# Patient Record
Sex: Female | Born: 1950 | Race: White | Hispanic: No | Marital: Single | Smoking: Former smoker
Health system: Southern US, Community
[De-identification: ages and names within clinical notes are randomized; demographics above are authoritative.]

## PROBLEM LIST (undated history)

## (undated) DIAGNOSIS — L509 Urticaria, unspecified: Secondary | ICD-10-CM

## (undated) DIAGNOSIS — E7800 Pure hypercholesterolemia, unspecified: Secondary | ICD-10-CM

## (undated) DIAGNOSIS — E039 Hypothyroidism, unspecified: Secondary | ICD-10-CM

## (undated) HISTORY — DX: Pure hypercholesterolemia, unspecified: E78.00

## (undated) HISTORY — DX: Urticaria, unspecified: L50.9

## (undated) HISTORY — DX: Hypothyroidism, unspecified: E03.9

---

## 1990-08-07 HISTORY — PX: TUBAL LIGATION: SHX77

## 2008-08-07 HISTORY — PX: HALLUX VALGUS CORRECTION: SUR315

## 2011-10-27 DIAGNOSIS — E063 Autoimmune thyroiditis: Secondary | ICD-10-CM | POA: Insufficient documentation

## 2011-10-27 DIAGNOSIS — L501 Idiopathic urticaria: Secondary | ICD-10-CM | POA: Insufficient documentation

## 2011-10-27 DIAGNOSIS — K589 Irritable bowel syndrome without diarrhea: Secondary | ICD-10-CM | POA: Insufficient documentation

## 2011-11-17 DIAGNOSIS — R42 Dizziness and giddiness: Secondary | ICD-10-CM | POA: Insufficient documentation

## 2012-11-06 ENCOUNTER — Other Ambulatory Visit: Payer: Self-pay | Admitting: Obstetrics & Gynecology

## 2012-11-06 DIAGNOSIS — R928 Other abnormal and inconclusive findings on diagnostic imaging of breast: Secondary | ICD-10-CM

## 2012-11-20 ENCOUNTER — Ambulatory Visit
Admission: RE | Admit: 2012-11-20 | Discharge: 2012-11-20 | Disposition: A | Discharge status: Home / Self Care | Payer: BC Managed Care – PPO | Source: Ambulatory Visit | Attending: Obstetrics & Gynecology | Admitting: Obstetrics & Gynecology

## 2012-11-20 DIAGNOSIS — R928 Other abnormal and inconclusive findings on diagnostic imaging of breast: Secondary | ICD-10-CM

## 2013-05-15 ENCOUNTER — Other Ambulatory Visit: Payer: Self-pay | Admitting: Obstetrics & Gynecology

## 2013-05-15 DIAGNOSIS — N6320 Unspecified lump in the left breast, unspecified quadrant: Secondary | ICD-10-CM

## 2013-05-28 ENCOUNTER — Ambulatory Visit
Admission: RE | Admit: 2013-05-28 | Discharge: 2013-05-28 | Disposition: A | Discharge status: Home / Self Care | Payer: BC Managed Care – PPO | Source: Ambulatory Visit | Attending: Obstetrics & Gynecology | Admitting: Obstetrics & Gynecology

## 2013-05-28 DIAGNOSIS — N6320 Unspecified lump in the left breast, unspecified quadrant: Secondary | ICD-10-CM

## 2014-01-21 DIAGNOSIS — E039 Hypothyroidism, unspecified: Secondary | ICD-10-CM | POA: Insufficient documentation

## 2014-01-21 DIAGNOSIS — M1612 Unilateral primary osteoarthritis, left hip: Secondary | ICD-10-CM | POA: Insufficient documentation

## 2014-04-09 DIAGNOSIS — M25552 Pain in left hip: Secondary | ICD-10-CM | POA: Insufficient documentation

## 2014-07-24 DIAGNOSIS — M7741 Metatarsalgia, right foot: Secondary | ICD-10-CM | POA: Insufficient documentation

## 2014-08-07 HISTORY — PX: TOTAL HIP ARTHROPLASTY: SHX124

## 2014-09-24 IMAGING — US US BREAST*L*
1 series · 2 of 2 positions shown · non-contrast
Comparison: Mammography 10/24/2012, 01/13/2011, 01/10/2010.  No
prior ultrasound.

CLINICAL DATA: Called back from screening mammography, possible
left breast mass.

DIGITAL DIAGNOSTIC LEFT MAMMOGRAM  AND LEFT BREAST ULTRASOUND:

[Series 1: us breast*left* · 2 of 2 slices shown]
[im 1/2]
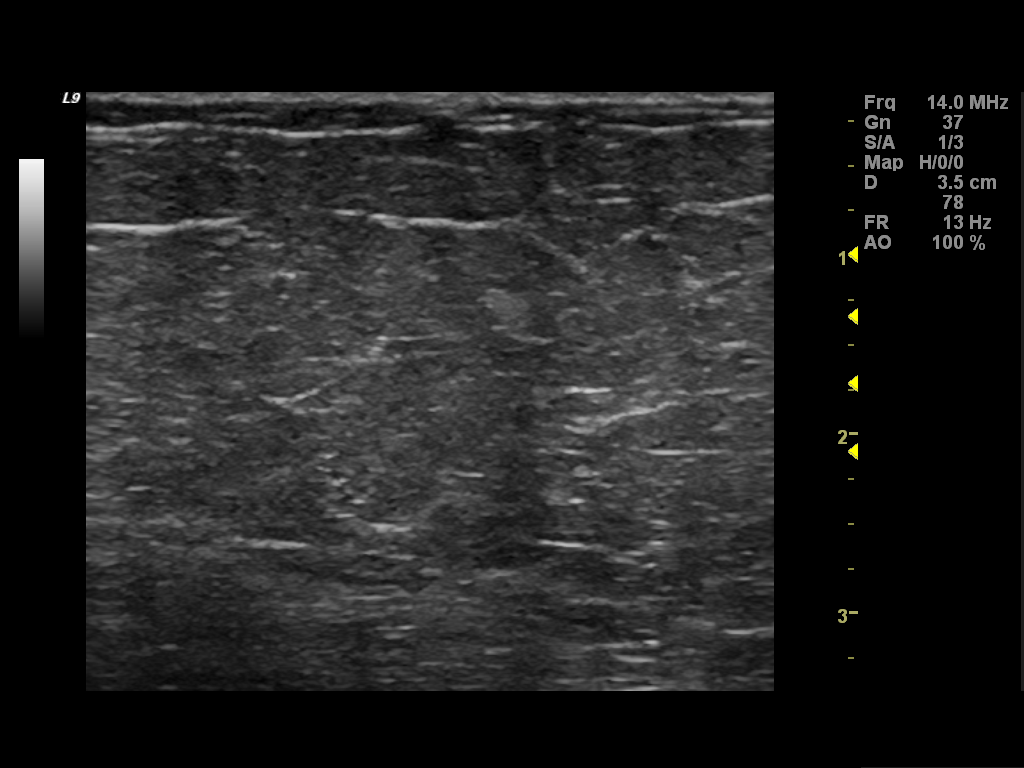
[im 2/2]
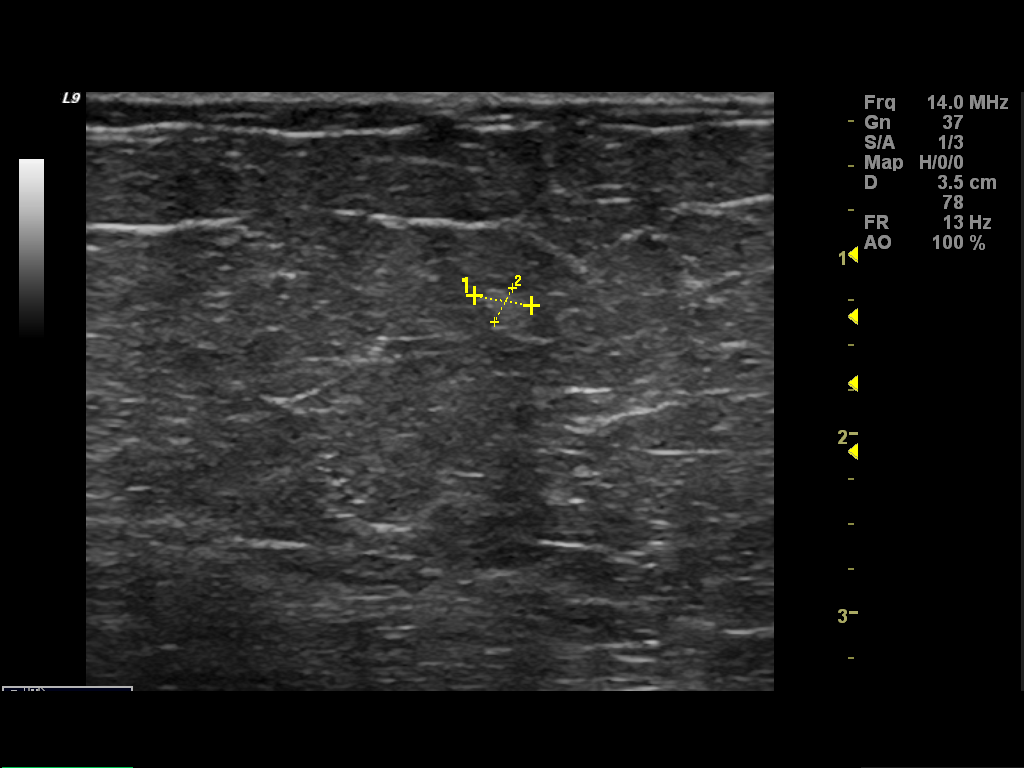

[2 of 2 positions shown; findings below may reference images not displayed]

FINDINGS: ACR Breast Density Category 2: There is a scattered fibroglandular
pattern.

Spot compression CC and MLO views of the area of concern in the
upper left breast were obtained.  There is a persistent vague
density on the spot compression views, but there is no associated
architectural distortion or suspicious calcifications.

On physical exam, there is no palpable abnormality in the upper
left breast.

Ultrasound is performed, showing a very small hyperechoic focus
within a fat lobule of the left breast at the 11 o'clock position,
7 cm from the nipple, measuring 0.3 x 0.2 cm in the antiradial
projection.  I was not able to find this in the radial projection
or upon subsequent imaging, so it may be artifactual and related to
a Cooper's ligament.  Certainly no suspicious solid mass or
abnormal acoustic shadowing was identified in the upper left
breast.
IMPRESSION: Persistent very small density in the upper left breast on
mammography without a definite sonographic correlate.  Spot
mammographic images suggest normal fibroglandular tissue.

RECOMMENDATION:
Short-term mammographic follow-up was discussed with the patient,
in lieu of the absence of a definite sonographic correlate.  Left
breast diagnostic mammogram and possible ultrasound are suggested
in 6 months.

If the patient changes her mind and wishes to have this density
biopsied, this would need to be done stereotactically.

I have discussed the findings and recommendations with the patient.
Results were also provided in writing at the conclusion of the
visit.  If applicable, a reminder letter will be sent to the
patient regarding the next appointment.

BI-RADS CATEGORY 3:  Probably benign finding(s) - short interval
follow-up suggested.

## 2015-04-01 IMAGING — MG MM DIAGNOSTIC UNILATERAL L
2 series · 2 of 2 positions shown · non-contrast
Comparison: 11/20/2012

CLINICAL DATA: The original dictation was inadvertently
electronically lost. This is a repeat dictation on 06/04/2013.

Followup left breast asymmetry.
EXAM:
DIGITAL DIAGNOSTIC  left MAMMOGRAM
Mammographic images were processed with CAD.

[L CC]
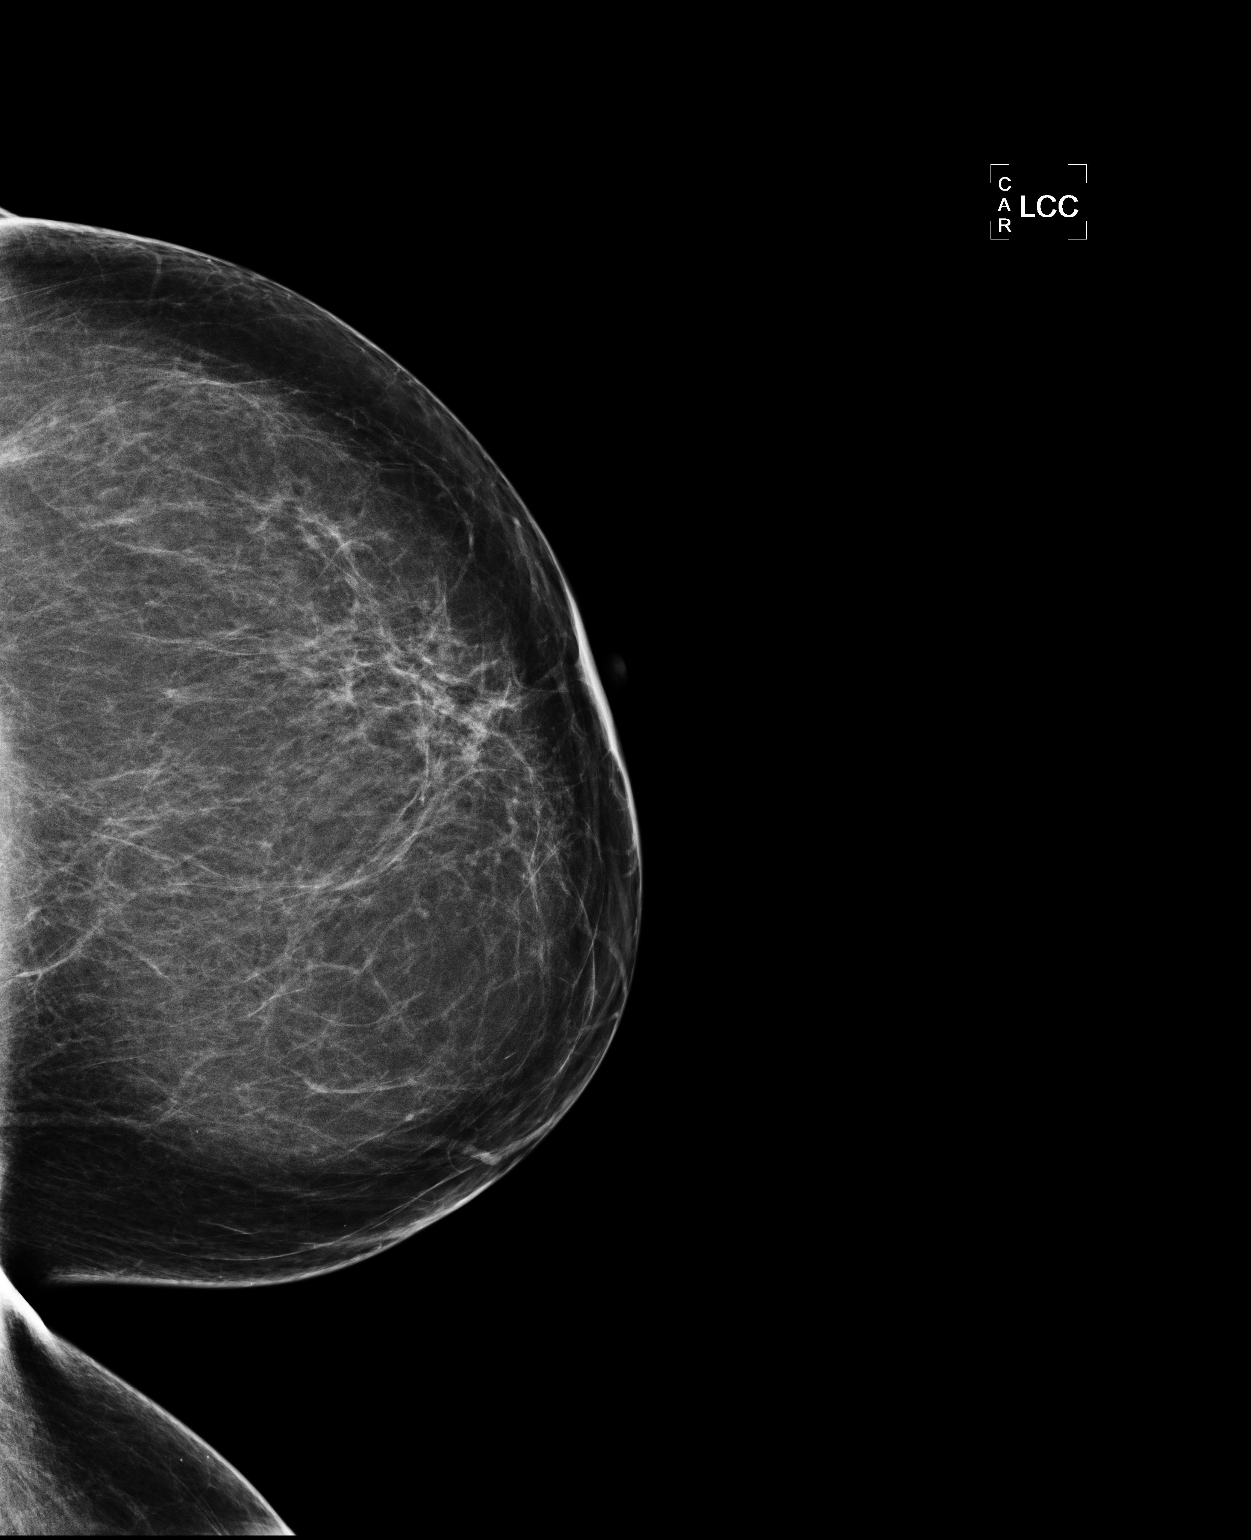

[L MLO]
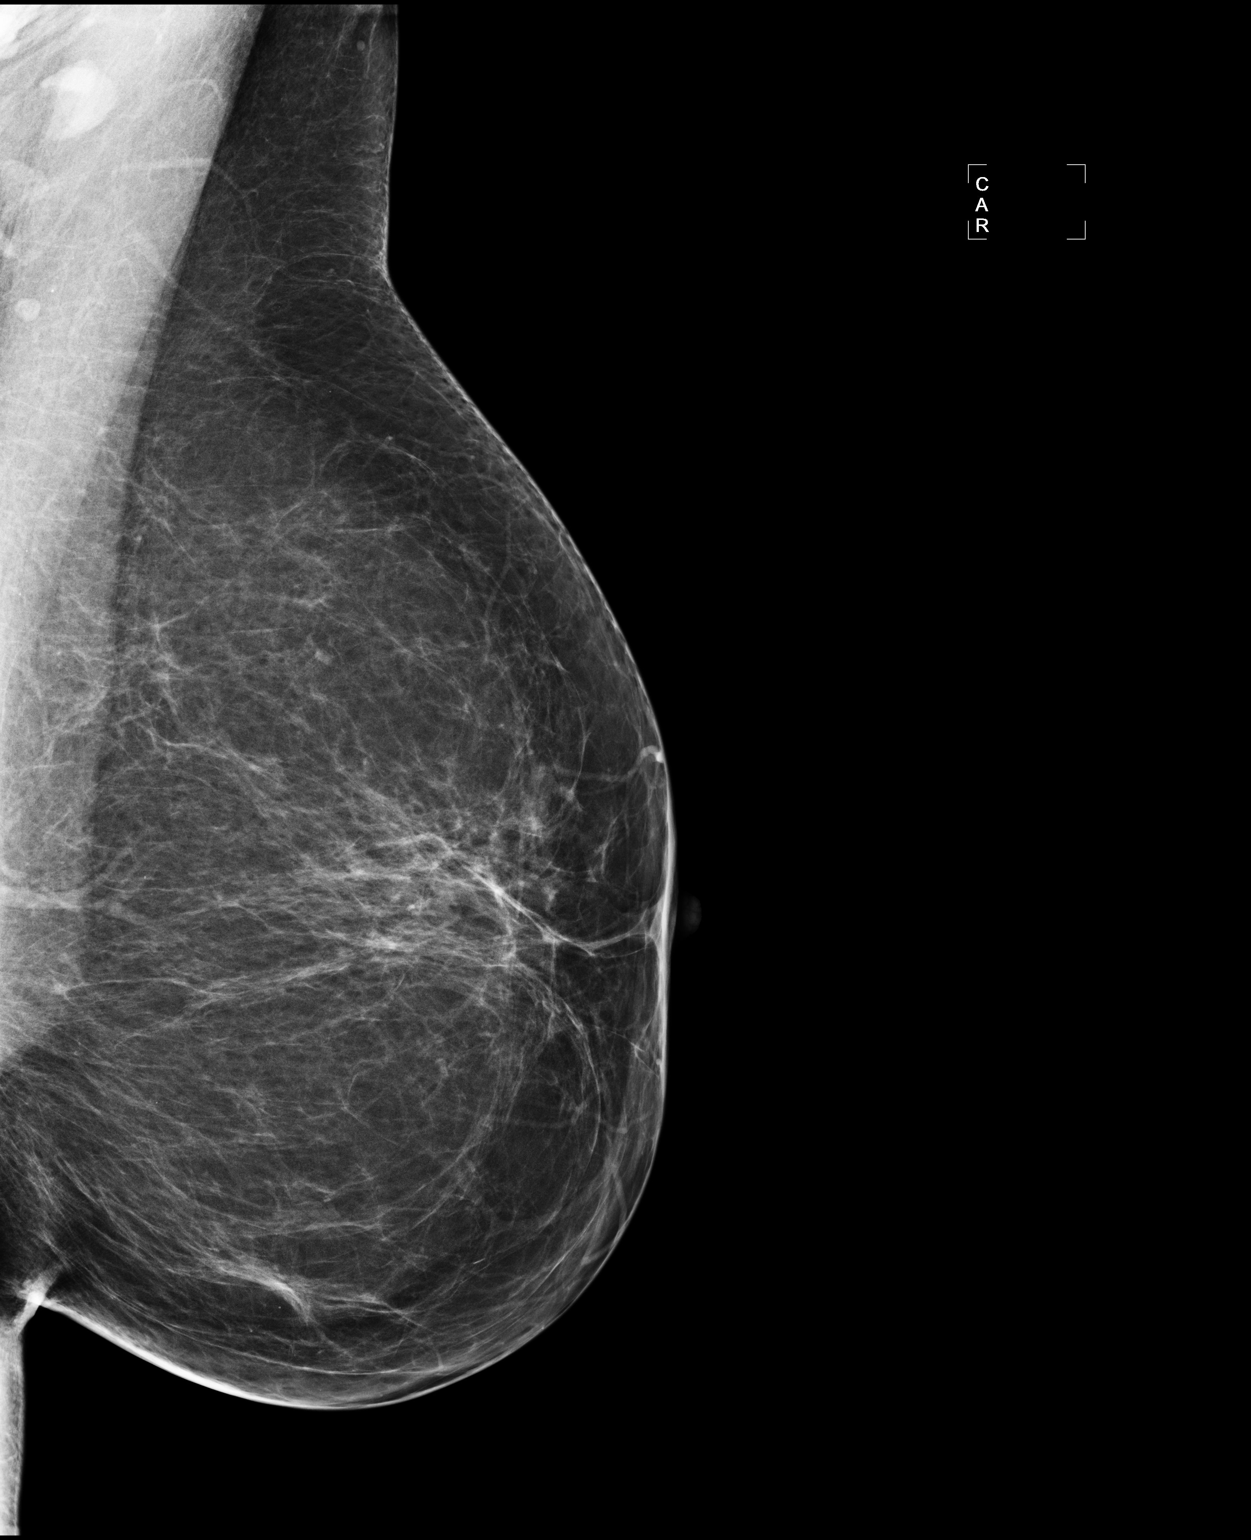

[2 of 2 positions shown; findings below may reference images not displayed]

ACR Breast Density Category b: There are scattered areas of
fibroglandular density.
FINDINGS: No suspicious finding is seen in the left breast.
IMPRESSION: No evidence for malignancy in the left breast. The previously
questioned finding is not reproduced on the current exam.

RECOMMENDATION:
Bilateral screening mammography November 2013

I have discussed the findings and recommendations with the patient.
Results were also provided in writing at the conclusion of the
visit. If applicable, a reminder letter will be sent to the patient
regarding the next appointment.

BI-RADS CATEGORY  1: Negative

## 2015-04-14 DIAGNOSIS — L501 Idiopathic urticaria: Secondary | ICD-10-CM | POA: Insufficient documentation

## 2016-06-06 ENCOUNTER — Ambulatory Visit: Payer: Self-pay | Admitting: Pediatrics

## 2016-06-13 ENCOUNTER — Encounter: Payer: Self-pay | Admitting: Pediatrics

## 2016-06-13 ENCOUNTER — Ambulatory Visit (INDEPENDENT_AMBULATORY_CARE_PROVIDER_SITE_OTHER): Payer: BLUE CROSS/BLUE SHIELD | Admitting: Pediatrics

## 2016-06-13 VITALS — BP 110/70 | HR 68 | Temp 98.1°F | Resp 16 | Ht 63.0 in | Wt 127.0 lb

## 2016-06-13 DIAGNOSIS — R5383 Other fatigue: Secondary | ICD-10-CM

## 2016-06-13 DIAGNOSIS — E038 Other specified hypothyroidism: Secondary | ICD-10-CM | POA: Diagnosis not present

## 2016-06-13 DIAGNOSIS — L503 Dermatographic urticaria: Secondary | ICD-10-CM | POA: Insufficient documentation

## 2016-06-13 DIAGNOSIS — E039 Hypothyroidism, unspecified: Secondary | ICD-10-CM | POA: Insufficient documentation

## 2016-06-13 DIAGNOSIS — L508 Other urticaria: Secondary | ICD-10-CM | POA: Insufficient documentation

## 2016-06-13 NOTE — Progress Notes (Signed)
[address] [place] [address] Dept: [phone]  New Patient Note  Patient ID: Morgan SayresKaren Lambert, female    DOB: 1951-01-17  Age: 65 y.o. MRN: [id number] Date of Office Visit: 06/13/2016 Referring provider: No referring provider defined for this encounter.    Chief Complaint: Urticaria (arms and inside thighs and face for 20 years, itching everyday )  HPI Morgan SayresKaren Lambert presents for evaluation of chronic urticaria for over 20 years. At one time her joints ached before  the urticaria appeared,. 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 Sometimes with the urticaria she has swelling of her upper lip and at times the nose. She needs prednisone 3 or 4 times per year because of her urticaria. Allergy skin testing many years ago was negative. She has been hypothyroid for about 26 years. 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 She has not had tick bites. She has not had blood work in the past few years to evaluate her chronic urticaria. She has urticaria despite the use of levocetirizine 5 mg at night, hydroxyzine 25 mg in the morning, montelukast  10 mg once a day, ranitidine 150 mg once a day and Benadryl as needed  Review of Systems  Constitutional: Negative.   HENT: Negative.   Eyes:       Beginning of a  cataract in one eye   Respiratory: Negative.   Cardiovascular: Negative.   Gastrointestinal: Negative.   Genitourinary: Negative.   Musculoskeletal:       Total left hip replacement.  Skin:       Chronic urticaria for over 20 years  Neurological:       History of migraine headaches  Endo/Heme/Allergies:       Hypothyroidism. No diabetes.  Psychiatric/Behavioral: Negative.     Outpatient Encounter Prescriptions as of 06/13/2016  Medication Sig  . atorvastatin (LIPITOR) 40 MG tablet   . Calcium Carb-Cholecalciferol (CALCIUM + D3) 600-200 MG-UNIT TABS Take by mouth.  . diphenhydrAMINE (BENADRYL) 25 mg capsule Take 25 mg by mouth.  . DOCOSAHEXAENOIC ACID PO Take by  mouth.  . fluticasone (FLONASE) 50 MCG/ACT nasal spray 2 sprays by Each Nare route daily.  . hydrOXYzine (ATARAX/VISTARIL) 25 MG tablet Take by mouth.  . levocetirizine (XYZAL) 5 MG tablet TAKE 1 TABLET (5 MG TOTAL) BY MOUTH EVERY EVENING.  Marland Kitchen. levothyroxine (SYNTHROID, LEVOTHROID) 75 MCG tablet   . montelukast (SINGULAIR) 10 MG tablet TAKE 1 TABLET (10 MG TOTAL) BY MOUTH NIGHTLY.  . ranitidine (ZANTAC) 150 MG tablet Take by mouth.   No facility-administered encounter medications on file as of 06/13/2016.      Drug Allergies:  Allergies  Allergen Reactions  . Sulfamethazine Itching    Family History: Morgan Lambert's family history includes Asthma in her daughter and grandchild; Eczema in her daughter..Food allergy in a granddaughter. There is no family history of hayfever, angioedema, eczema, hives, Lupus.  Social and environmental. There is a dog in the home. She is not exposed to cigarette smoking. She smoked cigarettes for 40 years and stopped smoking cigarettes 4 years ago.  Physical Exam: BP 110/70   Pulse 68   Temp 98.1 F (36.7 C) (Oral)   Resp 16   Ht 5\' 3"  (1.6 m)   Wt 127 lb (57.6 kg)   SpO2 97%   BMI 22.50 kg/m    Physical Exam  Constitutional: She is oriented to person, place, and time. She appears well-developed and well-nourished.  HENT:  Eyes normal. Ears normal. Nose normal. Pharynx normal.  Neck: Neck supple. No thyromegaly present.  Cardiovascular:  S1 and S2 normal no murmurs  Pulmonary/Chest:  Clear to percussion and auscultation  Abdominal: Soft. There is no tenderness (no hepatosplenomegaly).  Lymphadenopathy:    She has no cervical adenopathy.  Neurological: She is alert and oriented to person, place, and time.  Skin:  Clear but there was a suggestion of dermographia  Psychiatric: She has a normal mood and affect. Her behavior is normal. Judgment and thought content normal.  Vitals reviewed.   Diagnostics:  None  Assessment  Assessment and  Plan: 1. Fatigue, unspecified type   2. Chronic urticaria   3. Dermographia   4. Other specified hypothyroidism        Patient Instructions  Xyzal 5 mg the morning and hydroxyzine 25 mg at night Ranitidine 150 mg twice a day Montelukast  10 mg once a day Do foods with salicylates make you itch? Follow-up after Thanksgiving to do skin testing. Stop Xyzal and hydroxyzine and Benadryl 5 days before testing. Start prednisone 10 mg twice a day for 4 days 10 mg on the fifth day on the day that you stop the antihistamines I will call you with the results of your  lab work Information regarding the use of Xolair for chronic urticaria was given to the patient   Return in about 4 weeks (around 07/11/2016).   Thank you for the opportunity to care for this patient.  Please do not hesitate to contact me with questions.  Tonette BihariJ. A. Bardelas, M.D.  [facility] [address] [place], [place] [address] [phone]

## 2016-06-13 NOTE — Patient Instructions (Addendum)
Xyzal 5 mg the morning and hydroxyzine 25 mg at night Ranitidine 150 mg twice a day Montelukast  10 mg once a day Do foods with salicylates make you itch? Follow-up after Thanksgiving to do skin testing. Stop Xyzal and hydroxyzine and Benadryl 5 days before testing. Start prednisone 10 mg twice a day for 4 days 10 mg on the fifth day on the day that you stop the antihistamines I will call you with the results of your  lab work Information regarding the use of Xolair for chronic urticaria was given to the patient

## 2016-06-15 DIAGNOSIS — E7800 Pure hypercholesterolemia, unspecified: Secondary | ICD-10-CM | POA: Insufficient documentation

## 2016-06-19 ENCOUNTER — Ambulatory Visit: Payer: BLUE CROSS/BLUE SHIELD | Admitting: Pediatrics

## 2016-06-23 LAB — CBC WITH DIFFERENTIAL/PLATELET
BASOS ABS: 0.1 10*3/uL (ref 0.0–0.2)
Basos: 1 %
EOS (ABSOLUTE): 0.4 10*3/uL (ref 0.0–0.4)
EOS: 4 %
Hematocrit: 41.4 % (ref 34.0–46.6)
Hemoglobin: 13.9 g/dL (ref 11.1–15.9)
IMMATURE GRANULOCYTES: 0 %
Immature Grans (Abs): 0 10*3/uL (ref 0.0–0.1)
LYMPHS ABS: 2.3 10*3/uL (ref 0.7–3.1)
Lymphs: 23 %
MCH: 28 pg (ref 26.6–33.0)
MCHC: 33.6 g/dL (ref 31.5–35.7)
MCV: 84 fL (ref 79–97)
MONOCYTES: 9 %
MONOS ABS: 0.9 10*3/uL (ref 0.1–0.9)
NEUTROS PCT: 63 %
Neutrophils Absolute: 6.1 10*3/uL (ref 1.4–7.0)
Platelets: 168 10*3/uL (ref 150–379)
RBC: 4.96 x10E6/uL (ref 3.77–5.28)
RDW: 13.8 % (ref 12.3–15.4)
WBC: 9.7 10*3/uL (ref 3.4–10.8)

## 2016-06-23 LAB — COMPREHENSIVE METABOLIC PANEL
ALK PHOS: 73 IU/L (ref 39–117)
ALT: 16 IU/L (ref 0–32)
AST: 16 IU/L (ref 0–40)
Albumin/Globulin Ratio: 1.8 (ref 1.2–2.2)
Albumin: 4.4 g/dL (ref 3.6–4.8)
BUN/Creatinine Ratio: 15 (ref 12–28)
BUN: 11 mg/dL (ref 8–27)
Bilirubin Total: 0.3 mg/dL (ref 0.0–1.2)
CALCIUM: 10.2 mg/dL (ref 8.7–10.3)
CO2: 23 mmol/L (ref 18–29)
CREATININE: 0.75 mg/dL (ref 0.57–1.00)
Chloride: 104 mmol/L (ref 96–106)
GFR calc Af Amer: 98 mL/min/{1.73_m2} (ref >59)
GFR, EST NON AFRICAN AMERICAN: 85 mL/min/{1.73_m2} (ref >59)
GLUCOSE: 97 mg/dL (ref 65–99)
Globulin, Total: 2.5 g/dL (ref 1.5–4.5)
Potassium: 4.5 mmol/L (ref 3.5–5.2)
Sodium: 145 mmol/L — ABNORMAL HIGH (ref 134–144)
Total Protein: 6.9 g/dL (ref 6.0–8.5)

## 2016-06-23 LAB — ANA W/REFLEX IF POSITIVE: ANA: NEGATIVE

## 2016-06-23 LAB — SEDIMENTATION RATE: SED RATE: 2 mm/h (ref 0–40)

## 2016-06-23 LAB — CHRONIC URTICARIA

## 2016-07-06 ENCOUNTER — Telehealth: Payer: Self-pay

## 2016-07-06 NOTE — Telephone Encounter (Signed)
Per Dr. Beaulah DinningBardelas, call patient and ask how she is doing.  Is she still going to schedule an appointment for retesting to possibly start Xolair for hives?  Left message.

## 2016-07-10 NOTE — Telephone Encounter (Signed)
Called patient.  She is "maintaining with her hives".  She is very busy with traveling until the first of the year.  She will call then to schedule a skin testing appointment.  Dr. Beaulah DinningBardelas notified.

## 2016-09-04 ENCOUNTER — Encounter: Payer: Self-pay | Admitting: Pediatrics

## 2016-09-04 ENCOUNTER — Telehealth: Payer: Self-pay

## 2016-09-04 ENCOUNTER — Ambulatory Visit (INDEPENDENT_AMBULATORY_CARE_PROVIDER_SITE_OTHER): Payer: BLUE CROSS/BLUE SHIELD | Admitting: Pediatrics

## 2016-09-04 VITALS — BP 98/68 | HR 76 | Temp 98.1°F | Resp 16

## 2016-09-04 DIAGNOSIS — T7800XD Anaphylactic reaction due to unspecified food, subsequent encounter: Secondary | ICD-10-CM | POA: Diagnosis not present

## 2016-09-04 DIAGNOSIS — L508 Other urticaria: Secondary | ICD-10-CM

## 2016-09-04 DIAGNOSIS — L503 Dermatographic urticaria: Secondary | ICD-10-CM

## 2016-09-04 NOTE — Patient Instructions (Addendum)
Xyzal 5 mg in the morning and hydroxyzine 25 mg in the afternoon and at night Ranitidine 150 mg twice a day Montelukast  10 mg once a day We will go ahead and apply   for the administration of Xolair for chronic urticaria I will call you with the results of your lab work for food allergies Add  prednisone 20 mg twice a day for 3 days 20 mg on the fourth day 10 mg on the fifth day to bring the  hives under control Call me if you are not doing better on this treatment Continue your other medications

## 2016-09-04 NOTE — Telephone Encounter (Signed)
No telephone call needed

## 2016-09-04 NOTE — Progress Notes (Signed)
  [address] [place] [address] Dept: [phone]  FOLLOW UP NOTE  Patient ID: Morgan Lambert Speciale, female    DOB: April 16, 1951  Age: 66 y.o. MRN: [id number] Date of Office Visit: 09/04/2016  Assessment  Chief Complaint: Urticaria  HPI Morgan Lambert presents for follow-up of chronic urticaria. Foods with salicylates did not make her worse. She was scheduled to come in for some allergy testing , but when she stopped her antihistamines she started itching significantly despite prednisone 10 mg twice a day. She had lab work  for evaluation of urticaria She had a normal CBC with differential, normal complete metabolic panel, sedimentation rate of 2 mm/h, negative serum ANA. She has a history of thyroid antibodies..  She has continued to have itching and hives despite Xyzal 5 mg in the morning and hydroxyzine 25 mg in the afternoon and at night, ranitidine 150 mg twice a day, montelukast 10 mg once a day .Marland Kitchen.  Other medications are outlined in the chart   Drug Allergies:  Allergies  Allergen Reactions  . Sulfa Antibiotics     Physical Exam: BP 98/68   Pulse 76   Temp 98.1 F (36.7 C) (Oral)   Resp 16    Physical Exam  Constitutional: She is oriented to person, place, and time. She appears well-developed and well-nourished.  HENT:  Eyes normal. Ears normal. Nose normal. Pharynx normal.  Neck: Neck supple.  Cardiovascular:  S1 and S2 normal no murmurs  Pulmonary/Chest:  Clear to percussion and auscultation  Lymphadenopathy:    She has no cervical adenopathy.  Neurological: She is alert and oriented to person, place, and time.  Skin:  Several hives and dermographia  Psychiatric: She has a normal mood and affect. Her behavior is normal. Judgment and thought content normal.  Vitals reviewed.   Diagnostics:  none  Assessment and Plan: 1. Anaphylactic shock due to food, subsequent encounter   2. Chronic urticaria   3. Dermographia       Patient Instructions  Xyzal 5 mg  in the morning and hydroxyzine 25 mg in the afternoon and at night Ranitidine 150 mg twice a day Montelukast  10 mg once a day We will go ahead and apply   for the administration of Xolair for chronic urticaria I will call you with the results of your lab work for food allergies Add  prednisone 20 mg twice a day for 3 days 20 mg on the fourth day 10 mg on the fifth day to bring the  hives under control Call me if you are not doing better on this treatment   Return in about 4 weeks (around 10/02/2016).    Thank you for the opportunity to care for this patient.  Please do not hesitate to contact me with questions.  Tonette BihariJ. A. Eevie Lapp, M.D.  [facility]  [address] [place], [place] [address] [phone]

## 2016-09-05 ENCOUNTER — Other Ambulatory Visit: Payer: Self-pay | Admitting: Allergy and Immunology

## 2016-09-10 LAB — ALLERGEN PROFILE, FOOD-FRUIT
Allergen Apple, IgE: <0.1 kU/L
Allergen Banana IgE: <0.1 kU/L
Allergen Grape IgE: <0.1 kU/L
Allergen Pear IgE: <0.1 kU/L
Allergen, Peach f95: <0.1 kU/L

## 2016-09-10 LAB — ALLERGENS(7)
Brazil Nut IgE: <0.1 kU/L
F020-IgE Almond: <0.1 kU/L
F202-IgE Cashew Nut: <0.1 kU/L
Hazelnut (Filbert) IgE: <0.1 kU/L
Pecan Nut IgE: <0.1 kU/L
Walnut IgE: <0.1 kU/L

## 2016-09-10 LAB — ALLERGEN PROFILE, BASIC FOOD
Allergen Corn, IgE: <0.1 kU/L
Beef IgE: <0.1 kU/L
Chocolate/Cacao IgE: <0.1 kU/L
Egg, Whole IgE: <0.1 kU/L
Food Mix (Seafoods) IgE: NEGATIVE
Milk IgE: <0.1 kU/L
Peanut IgE: <0.1 kU/L
Pork IgE: <0.1 kU/L
Soybean IgE: <0.1 kU/L
Wheat IgE: <0.1 kU/L

## 2016-09-10 LAB — ALLERGEN PROFILE, SHELLFISH
Clam IgE: <0.1 kU/L
F023-IgE Crab: <0.1 kU/L
F080-IgE Lobster: <0.1 kU/L
F290-IgE Oyster: <0.1 kU/L
Scallop IgE: <0.1 kU/L
Shrimp IgE: <0.1 kU/L

## 2016-09-10 LAB — ALLERGEN PROFILE, FOOD-FISH
Allergen Mackerel IgE: <0.1 kU/L
Allergen Salmon IgE: <0.1 kU/L
Allergen Trout IgE: <0.1 kU/L
Allergen Walley Pike IgE: <0.1 kU/L
Codfish IgE: <0.1 kU/L
Halibut IgE: <0.1 kU/L
Tuna: <0.1 kU/L

## 2016-09-10 LAB — ALLERGEN PROFILE, FOOD-LEGUME: Allergen Lentil IgE: <0.1 kU/L

## 2016-09-10 LAB — ALLERGEN PROFILE, FOOD-GRAIN
Allergen Barley IgE: <0.1 kU/L
Allergen Oat IgE: <0.1 kU/L
Allergen Rice IgE: <0.1 kU/L
Rye IgE: <0.1 kU/L

## 2016-09-13 ENCOUNTER — Telehealth: Payer: Self-pay | Admitting: Allergy

## 2016-09-13 NOTE — Telephone Encounter (Signed)
Informed patient of lab work and also if she has not heard from us in two weeks about xolair to call us back.

## 2016-09-13 NOTE — Progress Notes (Signed)
Morgan Lambert had a serum IgE testing to a variety of foods. The serum IgE testing was completely negative. Please let patient know.. If she does not hear about Xolair approval for chronic urticaria in the next 2 weeks, please have her give Morgan Lambert a call

## 2016-09-25 ENCOUNTER — Other Ambulatory Visit: Payer: Self-pay | Admitting: Allergy

## 2016-09-25 MED ORDER — EPINEPHRINE 0.3 MG/0.3ML IJ SOAJ: 0.3000 mg | Freq: Once | INTRAMUSCULAR | 1 refills | Status: AC

## 2016-09-27 ENCOUNTER — Encounter: Payer: Self-pay | Admitting: Allergy and Immunology

## 2016-09-27 ENCOUNTER — Encounter: Payer: BLUE CROSS/BLUE SHIELD | Admitting: Allergy and Immunology

## 2016-09-27 NOTE — Progress Notes (Signed)
This encounter was created in error - please disregard.

## 2016-10-02 ENCOUNTER — Ambulatory Visit: Payer: BLUE CROSS/BLUE SHIELD | Admitting: Pediatrics

## 2016-10-10 ENCOUNTER — Ambulatory Visit: Payer: BLUE CROSS/BLUE SHIELD

## 2016-10-11 ENCOUNTER — Ambulatory Visit (INDEPENDENT_AMBULATORY_CARE_PROVIDER_SITE_OTHER): Payer: BLUE CROSS/BLUE SHIELD

## 2016-10-11 DIAGNOSIS — L500 Allergic urticaria: Secondary | ICD-10-CM | POA: Diagnosis not present

## 2016-10-11 MED ORDER — OMALIZUMAB 150 MG ~~LOC~~ SOLR
300.0000 mg | SUBCUTANEOUS | Status: AC
  Administered 2016-10-11 – 2016-11-23 (×2): 300 mg via SUBCUTANEOUS

## 2016-10-25 ENCOUNTER — Encounter: Payer: Self-pay | Admitting: Allergy and Immunology

## 2016-10-25 ENCOUNTER — Ambulatory Visit (INDEPENDENT_AMBULATORY_CARE_PROVIDER_SITE_OTHER): Payer: BLUE CROSS/BLUE SHIELD | Admitting: Allergy and Immunology

## 2016-10-25 VITALS — BP 118/72 | HR 76 | Temp 98.4°F | Resp 16

## 2016-10-25 DIAGNOSIS — L508 Other urticaria: Secondary | ICD-10-CM

## 2016-10-25 DIAGNOSIS — E038 Other specified hypothyroidism: Secondary | ICD-10-CM

## 2016-10-25 MED ORDER — METHYLPREDNISOLONE ACETATE 40 MG/ML IJ SUSP
40.0000 mg | Freq: Once | INTRAMUSCULAR | Status: AC
  Administered 2016-10-25: 40 mg via INTRAMUSCULAR

## 2016-10-25 NOTE — Progress Notes (Signed)
Follow-up Note  RE: Morgan Lambert MRN: [id number] DOB: 02-01-1951 Date of Office Visit: 10/25/2016  Primary care provider: Almedia BallsKELLY,SAM, MD Referring provider: Wilburn MylarKelly, Samuel S, MD  History of present illness: Morgan SayresKaren Lambert is a 66 y.o. female with chronic urticaria present today for reevaluation.  She has had chronic urticaria over the past 20 years.  She received her first Xolair injection 2 weeks ago with mild initial improvement, however she is primarily being seen today with a request for a steroid injection because of the severity of her pruritus/urticaria.  She is currently taking levocetirizine 5 mg daily, ranitidine 150 mg twice a day, hydroxyzine once or twice daily, diphenhydramine once or twice daily, and montelukast 10 mg daily.  Despite compliance with all of these medications she is still experiencing significant symptoms.  She is hypothyroid on levothyroxine is states that in the past she had an endocrinologist who increased her levothyroxine dose and the urticaria resolved over the course of 2 months while at that dose.  She had never previously had remission from the urticaria.  However, the endocrinologist was concerned about the high-dose of levothyroxine and readjusted to a lower dose and the urticaria recurred.   Assessment and plan: Chronic urticaria Intractable urticaria.  Proceed with Xolair injections for the next 2 or 3 rounds while monitoring for symptom reduction.  Continue levocetirizine 5 g daily, ranitidine 150 mg twice a day, montelukast 10 mg daily, hydroxyzine as needed, and/or diphenhydramine as needed.  Depo-Medrol 40 mg was administered in the office.  Several 10 mg tablets prednisone were provided as well for as needed use during urticaria flares uncontrolled by her other medications.  If symptoms persist or progress, consider adjusting levothyroxine dose while monitoring for symptom change.   Meds ordered this encounter  Medications  .  methylPREDNISolone acetate (DEPO-MEDROL) injection 40 mg    Physical examination: Blood pressure 118/72, pulse 76, temperature 98.4 F (36.9 C), temperature source Oral, resp. rate 16.  General: Alert, interactive, in no acute distress. Neck: Supple without lymphadenopathy. Lungs: Clear to auscultation without wheezing, rhonchi or rales. CV: Normal S1, S2 without murmurs. Skin: Scattered erythematous urticarial type lesions.  The following portions of the patient's history were reviewed and updated as appropriate: allergies, current medications, past family history, past medical history, past social history, past surgical history and problem list.  Allergies as of 10/25/2016      Reactions   Sulfa Antibiotics Itching      Medication List       Accurate as of 10/25/16 12:44 PM. Always use your most recent med list.          atorvastatin 40 MG tablet Commonly known as:  LIPITOR   Calcium + D3 600-200 MG-UNIT Tabs Take by mouth.   diphenhydrAMINE 25 mg capsule Commonly known as:  BENADRYL Take 25 mg by mouth.   fluticasone 50 MCG/ACT nasal spray Commonly known as:  FLONASE 2 sprays by Each Nare route daily.   hydrOXYzine 25 MG tablet Commonly known as:  ATARAX/VISTARIL Take by mouth.   levocetirizine 5 MG tablet Commonly known as:  XYZAL TAKE 1 TABLET (5 MG TOTAL) BY MOUTH EVERY EVENING.   levothyroxine 75 MCG tablet Commonly known as:  SYNTHROID, LEVOTHROID   montelukast 10 MG tablet Commonly known as:  SINGULAIR TAKE 1 TABLET (10 MG TOTAL) BY MOUTH NIGHTLY.   ranitidine 150 MG tablet Commonly known as:  ZANTAC Take 150 mg by mouth 2 (two) times daily.  Allergies  Allergen Reactions  . Sulfa Antibiotics Itching    I appreciate the opportunity to take part in Morgan Lambert's care. Please do not hesitate to contact me with questions.  Sincerely,   R. Jorene Guest, MD

## 2016-10-25 NOTE — Patient Instructions (Signed)
Chronic urticaria Intractable urticaria.  Proceed with Xolair injections for the next 2 or 3 rounds while monitoring for symptom reduction.  Continue levocetirizine 5 g daily, ranitidine 150 mg twice a day, montelukast 10 mg daily, hydroxyzine as needed, and/or diphenhydramine as needed.  Depo-Medrol 40 mg was administered in the office.  Several 10 mg tablets prednisone were provided as well for as needed use during urticaria flares uncontrolled by her other medications.  If symptoms persist or progress, consider adjusting levothyroxine dose while monitoring for symptom change.   Return in about 6 months (around 04/27/2017), or if symptoms worsen or fail to improve.

## 2016-10-25 NOTE — Assessment & Plan Note (Signed)
Intractable urticaria.  Proceed with Xolair injections for the next 2 or 3 rounds while monitoring for symptom reduction.  Continue levocetirizine 5 g daily, ranitidine 150 mg twice a day, montelukast 10 mg daily, hydroxyzine as needed, and/or diphenhydramine as needed.  Depo-Medrol 40 mg was administered in the office.  Several 10 mg tablets prednisone were provided as well for as needed use during urticaria flares uncontrolled by her other medications.  If symptoms persist or progress, consider adjusting levothyroxine dose while monitoring for symptom change.

## 2016-11-14 ENCOUNTER — Ambulatory Visit: Payer: Self-pay

## 2016-11-16 ENCOUNTER — Telehealth: Payer: Self-pay

## 2016-11-16 MED ORDER — PREDNISONE 10 MG PO TABS: 10.0000 mg | ORAL_TABLET | Freq: Every day | ORAL | 0 refills | Status: DC

## 2016-11-16 NOTE — Telephone Encounter (Signed)
Prednisone sent to pharmacy

## 2016-11-16 NOTE — Telephone Encounter (Signed)
Patient broke out with hives again.  She is going out of town tomorrow and would like Prednisone called in.  She is taking hydroxyzine 25 mg, Zantac 150 mg, levocetirizine 5 mg, benadryl 25 mg and montelukast but is still very itchy and broke out with hives.

## 2016-11-16 NOTE — Telephone Encounter (Signed)
Prednisone 10 mg x 7 days, then stop.

## 2016-11-17 ENCOUNTER — Ambulatory Visit: Payer: Self-pay

## 2016-11-21 ENCOUNTER — Ambulatory Visit: Payer: Self-pay

## 2016-11-23 ENCOUNTER — Ambulatory Visit (INDEPENDENT_AMBULATORY_CARE_PROVIDER_SITE_OTHER): Payer: BLUE CROSS/BLUE SHIELD

## 2016-11-23 DIAGNOSIS — L508 Other urticaria: Secondary | ICD-10-CM

## 2016-11-23 DIAGNOSIS — L501 Idiopathic urticaria: Secondary | ICD-10-CM

## 2016-11-24 ENCOUNTER — Telehealth: Payer: Self-pay

## 2016-11-24 MED ORDER — NYSTATIN 100000 UNIT/ML MT SUSP: 5.0000 mL | Freq: Four times a day (QID) | OROMUCOSAL | 0 refills | Status: AC

## 2016-11-24 NOTE — Telephone Encounter (Signed)
Reviewed note. Sent in prescription for nystatin suspension to swish and spit QID for seven days.  Malachi Bonds, MD FAAAAI [facility]

## 2016-11-24 NOTE — Telephone Encounter (Signed)
LM letting her know we sent the RX in

## 2016-11-24 NOTE — Telephone Encounter (Signed)
Pt. Wants to know if we can call out nystatin suspension bc of her having thrush after 2 rounds of prednisone given to her at the first part of the month and just last week?

## 2016-12-25 ENCOUNTER — Telehealth: Payer: Self-pay | Admitting: *Deleted

## 2016-12-25 MED ORDER — PREDNISONE 10 MG PO TABS: ORAL_TABLET | ORAL | 0 refills | Status: DC

## 2016-12-25 NOTE — Telephone Encounter (Signed)
Please call in prednisone, 20 mg x 5 days, 10 mg x3 day, then stop. I spoke with the patient, she has only had 2 rounds of Xolair will try 1 or 2 more rounds of Xolair before calling it a failure. She will schedule with an endocrinologist to see if he/she will adjust the levothyroxine dose.

## 2016-12-25 NOTE — Telephone Encounter (Addendum)
Called in Rx.  Patient notified.   NOTE:  Morgan Lambert had already called in Rx to pharmacy.  Confirmed.

## 2016-12-25 NOTE — Telephone Encounter (Signed)
[facility] and patient informed

## 2016-12-25 NOTE — Telephone Encounter (Signed)
Patient is breaking out in hives all over. Patient states Morgan Lambert is not working at all. She doesn't know what to do. Patient is wanting a return call from a nurse regaurding her xoliar and she is also wondering if we can prescribe prednisone 20mg , she does not think the 10mg  is working that well. [facility] [address]

## 2016-12-25 NOTE — Telephone Encounter (Signed)
Patient states, that Xolair is not helping her and doesn't want to schedule another appointment for it because it's not helping. She is broken out in hives this morning and would like some prednisone 20 mg called out.

## 2016-12-28 ENCOUNTER — Telehealth: Payer: Self-pay | Admitting: Allergy and Immunology

## 2016-12-28 NOTE — Telephone Encounter (Signed)
Called patient.  She states Bobbitt wants to refer her for an endocrinology evaluation concerning her hives.  She wants to go to:  [facility] [address] [place], [place] [address] [phone] phone [phone] fax  [facility] requires a referral from Dr. Nunzio CobbsBobbitt. Please advise.

## 2016-12-28 NOTE — Telephone Encounter (Signed)
Proceed with referral. 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 Thanks.

## 2016-12-28 NOTE — Telephone Encounter (Signed)
Please call patient back regarding a referral that she says Dr. Nunzio CobbsBobbitt said she will need to see an Endocrinologist about. Thanks

## 2017-01-03 NOTE — Telephone Encounter (Signed)
Faxed records over to [facility]. I have tried calling [facility] 3 times to set up her an appt. Will Try calling again later.  Left a voicemail for the patient informing her of everything.  Thanks

## 2017-01-03 NOTE — Telephone Encounter (Signed)
Thanks

## 2017-01-24 ENCOUNTER — Ambulatory Visit: Payer: BLUE CROSS/BLUE SHIELD | Admitting: Allergy & Immunology

## 2017-02-27 DIAGNOSIS — E559 Vitamin D deficiency, unspecified: Secondary | ICD-10-CM | POA: Insufficient documentation

## 2017-04-24 ENCOUNTER — Other Ambulatory Visit: Payer: Self-pay

## 2017-04-24 MED ORDER — HYDROXYZINE HCL 25 MG PO TABS: 25.0000 mg | ORAL_TABLET | Freq: Two times a day (BID) | ORAL | 0 refills | Status: DC

## 2017-05-02 ENCOUNTER — Telehealth: Payer: Self-pay

## 2017-05-02 NOTE — Telephone Encounter (Signed)
Spoke with pt and was able to get her scheduled for tomorrow 05/03/2017 at 11:15

## 2017-05-02 NOTE — Telephone Encounter (Signed)
Pt started breaking out a month ago it was manageable until now. She has been on prednisone for 2 weeks an it is not helping. She believe she needs a steroid shot. Do you believe the shot will help? She says she can not come in today but possibly tomorrow, as you have nothing avaliable. Would you want to try to work her in?  Please advise

## 2017-05-02 NOTE — Telephone Encounter (Signed)
Yes, we can try to work her in today or tomorrow.

## 2017-05-03 ENCOUNTER — Ambulatory Visit (INDEPENDENT_AMBULATORY_CARE_PROVIDER_SITE_OTHER): Payer: BLUE CROSS/BLUE SHIELD | Admitting: Allergy and Immunology

## 2017-05-03 ENCOUNTER — Encounter: Payer: Self-pay | Admitting: Allergy and Immunology

## 2017-05-03 VITALS — BP 102/68 | HR 72 | Temp 98.4°F | Resp 16

## 2017-05-03 DIAGNOSIS — L508 Other urticaria: Secondary | ICD-10-CM | POA: Diagnosis not present

## 2017-05-03 MED ORDER — DOXEPIN HCL 25 MG PO CAPS: ORAL_CAPSULE | ORAL | 3 refills | Status: DC

## 2017-05-03 MED ORDER — PREDNISONE 10 MG PO TABS: ORAL_TABLET | ORAL | 0 refills | Status: DC

## 2017-05-03 MED ORDER — LEVOCETIRIZINE DIHYDROCHLORIDE 5 MG PO TABS: ORAL_TABLET | ORAL | 5 refills | Status: DC

## 2017-05-03 MED ORDER — RANITIDINE HCL 150 MG PO TABS: ORAL_TABLET | ORAL | 5 refills | Status: DC

## 2017-05-03 MED ORDER — MONTELUKAST SODIUM 10 MG PO TABS: 10.0000 mg | ORAL_TABLET | Freq: Every day | ORAL | 5 refills | Status: DC

## 2017-05-03 NOTE — Patient Instructions (Signed)
Chronic urticaria Currently with suboptimal control.  May increase to levocetirizine 5 mg twice a day, if needed.  A refill prescription has been provided.  A prescription has been provided for doxepin 25 mg daily at bedtime, as needed.  Continue ranitidine 150 mg twice a day and montelukast 10 mg daily.  A prescription has been provided for prednisone 10 mg tablets, #30.  She may take 5 or 10 mg if needed for breakthrough symptoms.  If the urticaria persist or progress, we will consider sulfasalazine.   Return in about 6 months (around 10/31/2017), or if symptoms worsen or fail to improve.

## 2017-05-03 NOTE — Assessment & Plan Note (Signed)
Currently with suboptimal control.  May increase to levocetirizine 5 mg twice a day, if needed.  A refill prescription has been provided.  A prescription has been provided for doxepin 25 mg daily at bedtime, as needed.  Continue ranitidine 150 mg twice a day and montelukast 10 mg daily.  A prescription has been provided for prednisone 10 mg tablets, #30.  She may take 5 or 10 mg if needed for breakthrough symptoms.  If the urticaria persist or progress, we will consider sulfasalazine.

## 2017-05-03 NOTE — Progress Notes (Signed)
Follow-up Note  RE: Morgan Lambert MRN: [id number] DOB: 10-19-1950 Date of Office Visit: 05/03/2017  Primary care provider: Wilburn Mylar, MD Referring provider: Wilburn Mylar, MD  History of present illness: Morgan Lambert is a 66 y.o. female with chronic urticaria and hypothyroidism presenting today for an acute visit.  She reports that she is still experiencing troublesome urticaria.  She has been taking levocetirizine 5 mg daily, ranitidine 150 mg twice a day, and montelukast 10 mg daily.  Despite this, she typically requires 5-10 mg of prednisone per day to keep the hives under control.  She initially came in today with the hopes of getting a steroid injection, however changed her mind because her hives have been "bearable" over the past few days despite having been off of steroids for 3 or 4 days.  She received 2 Xolair injections without perceived benefit and so discontinued this therapy.   Assessment and plan: Chronic urticaria Currently with suboptimal control.  May increase to levocetirizine 5 mg twice a day, if needed.  A refill prescription has been provided.  A prescription has been provided for doxepin 25 mg daily at bedtime, as needed.  Continue ranitidine 150 mg twice a day and montelukast 10 mg daily.  A prescription has been provided for prednisone 10 mg tablets, #30.  She may take 5 or 10 mg if needed for breakthrough symptoms.  If the urticaria persist or progress, we will consider sulfasalazine.   Meds ordered this encounter  Medications  . levocetirizine (XYZAL) 5 MG tablet    Sig: Take 1 tablet by mouth once daily for hives    Dispense:  34 tablet    Refill:  5  . montelukast (SINGULAIR) 10 MG tablet    Sig: Take 1 tablet (10 mg total) by mouth at bedtime.    Dispense:  34 tablet    Refill:  5  . ranitidine (ZANTAC) 150 MG tablet    Sig: Take 1 tablet twice daily for hives    Dispense:  60 tablet    Refill:  5  . doxepin (SINEQUAN) 25 MG capsule   Sig: Take 1 capsule once daily at bedtime    Dispense:  30 capsule    Refill:  3  . predniSONE (DELTASONE) 10 MG tablet    Sig: Take 1 tablet by mouth once daily.    Dispense:  30 tablet    Refill:  0    Physical examination: Blood pressure 102/68, pulse 72, temperature 98.4 F (36.9 C), temperature source Oral, resp. rate 16.  General: Alert, interactive, in no acute distress. Neck: Supple without lymphadenopathy. Lungs: Clear to auscultation without wheezing, rhonchi or rales. CV: Normal S1, S2 without murmurs. Skin: Warm and dry, without lesions or rashes.  The following portions of the patient's history were reviewed and updated as appropriate: allergies, current medications, past family history, past medical history, past social history, past surgical history and problem list.  Allergies as of 05/03/2017      Reactions   Sulfa Antibiotics Itching      Medication List       Accurate as of 05/03/17  1:40 PM. Always use your most recent med list.          atorvastatin 40 MG tablet Commonly known as:  LIPITOR   CALCIUM 1000 + D 1000-800 MG-UNIT Tabs Generic drug:  Calcium Carb-Cholecalciferol   diphenhydrAMINE 25 mg capsule Commonly known as:  BENADRYL Take 25 mg by mouth.   doxepin 25  MG capsule Commonly known as:  SINEQUAN Take 1 capsule once daily at bedtime   fluticasone 50 MCG/ACT nasal spray Commonly known as:  FLONASE 2 sprays by Each Nare route daily.   fluticasone 50 MCG/ACT nasal spray Commonly known as:  FLONASE 2 sprays by Each Nare route daily.   hydrOXYzine 25 MG tablet Commonly known as:  ATARAX/VISTARIL TAKE ONE TABLET BY MOUTH FOUR TIMES A DAY   levocetirizine 5 MG tablet Commonly known as:  XYZAL Take 1 tablet by mouth once daily for hives   levothyroxine 75 MCG tablet Commonly known as:  SYNTHROID, LEVOTHROID   montelukast 10 MG tablet Commonly known as:  SINGULAIR Take 1 tablet (10 mg total) by mouth at bedtime.   predniSONE 20  MG tablet Commonly known as:  DELTASONE Take 20 mg by mouth.   predniSONE 10 MG tablet Commonly known as:  DELTASONE Take 1 tablet by mouth once daily.   ranitidine 150 MG tablet Commonly known as:  ZANTAC Take 1 tablet twice daily for hives            Discharge Care Instructions        Start     Ordered   05/03/17 0000  levocetirizine (XYZAL) 5 MG tablet     05/03/17 1233   05/03/17 0000  montelukast (SINGULAIR) 10 MG tablet  Daily at bedtime     05/03/17 1233   05/03/17 0000  ranitidine (ZANTAC) 150 MG tablet     05/03/17 1233   05/03/17 0000  doxepin (SINEQUAN) 25 MG capsule     05/03/17 1233   05/03/17 0000  predniSONE (DELTASONE) 10 MG tablet     05/03/17 1233      Allergies  Allergen Reactions  . Sulfa Antibiotics Itching    I appreciate the opportunity to take part in Morgan Lambert's care. Please do not hesitate to contact me with questions.  Sincerely,   R. Jorene Guest, MD

## 2017-06-07 ENCOUNTER — Ambulatory Visit: Payer: BLUE CROSS/BLUE SHIELD | Admitting: Allergy and Immunology

## 2017-10-12 DIAGNOSIS — M19011 Primary osteoarthritis, right shoulder: Secondary | ICD-10-CM | POA: Insufficient documentation

## 2017-11-03 ENCOUNTER — Other Ambulatory Visit: Payer: Self-pay | Admitting: Allergy and Immunology

## 2017-11-03 DIAGNOSIS — L508 Other urticaria: Secondary | ICD-10-CM

## 2017-11-05 NOTE — Telephone Encounter (Signed)
Courtesy refill  

## 2017-11-06 ENCOUNTER — Ambulatory Visit (INDEPENDENT_AMBULATORY_CARE_PROVIDER_SITE_OTHER): Payer: Medicare HMO | Admitting: Family Medicine

## 2017-11-06 ENCOUNTER — Telehealth: Payer: Self-pay | Admitting: Family Medicine

## 2017-11-06 ENCOUNTER — Encounter: Payer: Self-pay | Admitting: Family Medicine

## 2017-11-06 VITALS — BP 134/76 | HR 76 | Temp 97.9°F | Resp 16 | Ht 62.6 in | Wt 130.6 lb

## 2017-11-06 DIAGNOSIS — E039 Hypothyroidism, unspecified: Secondary | ICD-10-CM | POA: Diagnosis not present

## 2017-11-06 DIAGNOSIS — L508 Other urticaria: Secondary | ICD-10-CM | POA: Diagnosis not present

## 2017-11-06 MED ORDER — HYDROXYZINE HCL 25 MG PO TABS
ORAL_TABLET | ORAL | 1 refills | Status: DC
Start: 2017-11-06 — End: 2018-01-03

## 2017-11-06 MED ORDER — PREDNISONE 10 MG PO TABS: ORAL_TABLET | ORAL | 0 refills | Status: DC

## 2017-11-06 MED ORDER — MONTELUKAST SODIUM 10 MG PO TABS: ORAL_TABLET | ORAL | 5 refills | Status: DC

## 2017-11-06 NOTE — Telephone Encounter (Signed)
She reports that she has a follow up appointment with her endocrinologist later this month evaluate the dose of Levothyroxine.

## 2017-11-06 NOTE — Progress Notes (Signed)
[address] [place] [address] Dept: [phone]  FOLLOW UP NOTE  Patient ID: Morgan Lambert, female    DOB: 08/11/1950  Age: 67 y.o. MRN: [id number] Date of Office Visit: 11/06/2017  Assessment  Chief Complaint: Urticaria (slight outbreak last few days)  HPI Morgan Lambert is a 67 year old female who presents to the clinic for follow-up visit.  She was last seen in this clinic on 05/03/2017 by Dr. Nunzio Cobbs for follow-up of chronic urticaria and hypothyroidism.  At that visit, she was continuing to experience some breakthrough urticaria despite compliance with medications including levocetirizine, ranitidine, and montelukast.  She typically required 5-10 mg of prednisone several days out of the week.  She has received 2 injections of Xolair without any improvement and has since discontinued this therapy.  At today's visit, she reports she still has intermittent hives, however, she feels as though these episodes are decreasing.  She continues to take levocetirizine 5 mg once a day at nighttime, cetirizine 10 mg once a day in the morning, ranitidine 150 mg once or twice a day, hydroxyzine 25 mg once a day, and montelukast 10 mg once a day.  She reports that she took 2 doxepin 25 mg for 2 consecutive days which made her extremely groggy and she discontinued that therapy. She continues to take 5-10 mg of prednisone as needed for extreme breakthrough hives and itching not controlled with her regular medications. She has used a total of 25 tablets of prednisone 10 mg in the past 6 months  Her current medications are listed in the chart.  Drug Allergies:  Allergies  Allergen Reactions  . Sulfa Antibiotics Itching  . Sulfasalazine Itching    Physical Exam: BP 134/76 (BP Location: Right Arm, Patient Position: Sitting, Cuff Size: Small)   Pulse 76   Temp 97.9 F (36.6 C) (Oral)   Resp 16   Ht 5' 2.6" (1.59 m)   Wt 130 lb 9.6 oz (59.2 kg)   BMI 23.43 kg/m    Physical Exam    Constitutional: She is oriented to person, place, and time. She appears well-developed and well-nourished.  HENT:  Head: Normocephalic and atraumatic.  Right Ear: External ear normal.  Left Ear: External ear normal.  Nose: Nose normal.  Mouth/Throat: Oropharynx is clear and moist.  Eyes: Conjunctivae are normal.  Neck: Normal range of motion. Neck supple.  Cardiovascular: Normal rate and regular rhythm.  No murmurs noted  Pulmonary/Chest: Effort normal and breath sounds normal.  Lungs clear to auscultation  Musculoskeletal: Normal range of motion.  Neurological: She is alert and oriented to person, place, and time.  Skin: Skin is warm and dry.  Raised erythematous area noted on the right forearm.  No open area.  No drainage noted  Psychiatric: She has a normal mood and affect. Her behavior is normal. Judgment and thought content normal.      Assessment and Plan: 1. Hypothyroidism, unspecified type   2. Chronic urticaria     Meds ordered this encounter  Medications  . predniSONE (DELTASONE) 10 MG tablet    Sig: Take 5mg  or 10 mg once a day for breakthrough hives.    Dispense:  30 tablet    Refill:  0  . montelukast (SINGULAIR) 10 MG tablet    Sig: One tablet once a day to control itching and hives    Dispense:  30 tablet    Refill:  5  . hydrOXYzine (ATARAX/VISTARIL) 25 MG tablet    Sig: One tablet  once a day as needed for itching or hives.    Dispense:  30 tablet    Refill:  1    Patient Instructions  Chronic Urticaria Continue levocetirizine 5 mg once a day to control itching. You may increase to levocetirizine 5 mg twice a day if needed for breakthrough itching or hives Continue ranitidine 150 once or twice a day for control of itching Continue montelukast 10 mg once a day to control itching and hives Prednisone 10 mg. # 30. Take 5mg  or 10 mg for breakthrough itching or hives.  Continue Hydroxyzine 25 mg once a day as needed for itching or hives See your  endocrinologist to increase your thyroid replacement medication (Levothyroxine) to the dose that made your hives more controlled.  Continue your other medications as listed in the chart  Call me if this plan is not working well for you  Follow up with Dr. Nunzio CobbsBobbitt in 6 months or sooner if needed   Return in about 6 months (around 05/08/2018), or if symptoms worsen or fail to improve.   Thank you for the opportunity to care for this patient.  Please do not hesitate to contact me with questions.  Morgan LeylandAnne Ambs, FNP [facility]  I have provided oversight concerning Morgan Leylandnne Ambs' evaluation and treatment of this patient's health issues addressed during today's encounter. I agree with the assessment and therapeutic plan as outlined in the note.   Thank you for the opportunity to care for this patient.  Please do not hesitate to contact me with questions.  Tonette BihariJ. A. Deoni Cosey, M.D.  [facility] [address] [place], [place] [address] [phone]

## 2017-11-06 NOTE — Patient Instructions (Addendum)
Chronic Urticaria Continue levocetirizine 5 mg once a day to control itching. You may increase to levocetirizine 5 mg twice a day if needed for breakthrough itching or hives Continue ranitidine 150 once or twice a day for control of itching Continue montelukast 10 mg once a day to control itching and hives Prednisone 10 mg. # 30. Take 5mg  or 10 mg for breakthrough itching or hives.  Continue Hydroxyzine 25 mg once a day as needed for itching or hives See your endocrinologist to increase your thyroid replacement medication (Levothyroxine) to the dose that made your hives more controlled.  Continue your other medications as listed in the chart  Call me if this plan is not working well for you  Follow up with Dr. Nunzio CobbsBobbitt in 6 months or sooner if needed

## 2017-11-07 ENCOUNTER — Other Ambulatory Visit: Payer: Self-pay | Admitting: *Deleted

## 2017-11-07 NOTE — Telephone Encounter (Signed)
Hydroxyzine is on back order, per Dr. Beaulah DinningBardelas we will switch to Atarax. Sent to pharmacy on file.

## 2017-11-11 ENCOUNTER — Other Ambulatory Visit: Payer: Self-pay | Admitting: Allergy and Immunology

## 2017-11-11 DIAGNOSIS — L508 Other urticaria: Secondary | ICD-10-CM

## 2017-11-14 ENCOUNTER — Telehealth: Payer: Self-pay

## 2017-11-14 NOTE — Telephone Encounter (Signed)
Called [employer] to check on status of PA for Hydroxyzine 25 mg.  Per [employer],  Hydroxyzine 25 mg was approved.  Approval ID # [id number] DATES:  08/05/2017 - 08/06/2018  [facility] [phone].

## 2017-11-14 NOTE — Telephone Encounter (Signed)
Noticed Morgan LloydLinda Collins had already sent med in same day.

## 2017-12-07 ENCOUNTER — Encounter: Payer: Self-pay | Admitting: Allergy & Immunology

## 2017-12-07 ENCOUNTER — Ambulatory Visit: Payer: Medicare HMO | Admitting: Allergy & Immunology

## 2017-12-07 VITALS — BP 126/70 | HR 72 | Temp 97.9°F | Resp 16

## 2017-12-07 DIAGNOSIS — L501 Idiopathic urticaria: Secondary | ICD-10-CM | POA: Diagnosis not present

## 2017-12-07 DIAGNOSIS — J310 Chronic rhinitis: Secondary | ICD-10-CM | POA: Diagnosis not present

## 2017-12-07 MED ORDER — METHYLPREDNISOLONE ACETATE 40 MG/ML IJ SUSP
40.0000 mg | Freq: Once | INTRAMUSCULAR | Status: AC
  Administered 2017-12-07: 40 mg via INTRAMUSCULAR

## 2017-12-07 NOTE — Progress Notes (Signed)
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 Chronic Urticaria - not very responsive to H1/H2 blockers and montelukast - Continue Xyzal (levocetirizine) 5 mg one table twice daily. - Add on Zyrtec (cetirizine)  twice daily.   - Continue ranitidine 150 once or twice a day for control of itching. - Continue montelukast 10 mg once a day to control itching and hives - Continue with hydroxyzine 25 mg once a day as needed for itching or hives as needed for breathing hives - Strongly consider Xolair.  - DepoMedrol provided in clinic, per patient request.  - We did discuss the risk of recurrent prednisone courses, especially in someone of her age, including osteoporosis, skin thinning, and overall immune suppression.  2. Postnasal drip - We will look for environmental allergies in your blood today. - Nasacort sample provided (one spray per nostril up to twice daily).   3. Return in about 1 week (around 12/14/2017) or earlier when you decide to restart Xolair.   Subjective:   Morgan Lambert is a 67 y.o. female presenting today for follow up of  Chief Complaint  Patient presents with  . Urticaria    started to worsen last month. anytime she decreases prednisone her hives and itching worsen.     Morgan Lambert has a history of the following: Patient Active Problem List   Diagnosis Date Noted  . Primary osteoarthritis of right shoulder 10/12/2017  . Vitamin D deficiency 02/27/2017  . Pure hypercholesterolemia 06/15/2016  . Fatigue 06/13/2016  . Chronic urticaria 06/13/2016  . 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 Hypothyroidism 06/13/2016  . Chronic idiopathic urticaria 04/14/2015  . Metatarsalgia of right foot 07/24/2014  . Arthralgia of left hip 04/09/2014  . Osteoarthritis of left hip 01/21/2014  . Acquired hypothyroidism 01/21/2014  .  Vertigo 11/17/2011  . Hashimoto's thyroiditis 10/27/2011  . IBS (irritable bowel syndrome) 10/27/2011  . Idiopathic urticaria 10/27/2011    History obtained from: chart review and patient.  Reinaldo Berber Primary Care Provider is Wilburn Mylar, MD.     Morgan Lambert is a 67 y.o. female presenting for a follow up visit.  She was last seen approximately 1 month ago by and our nurse practitioner.  At that time, she was having chronic urticaria.  We continued her on Xyzal 5 mg daily, increasing to 5 mg twice daily for breakthrough itching.  We continued her on ranitidine 150 mg 1-2 times daily as well as Singulair 10 mg daily.  She received prednisone and was instructed to take 5 to 10 mg as needed for breakthrough hives.  She was also continued on hydroxyzine 25 mg daily.   Since the last visit, she has continued to have problems with urticaria.  She is taking all of the medications that were prescribed at the last visit but continues to have intermittent breakouts.  She will take 5 to 10 mg of prednisone as needed, and throughout the month of April she has taken approximately 200 mg of prednisone total.  She had a particularly bad breakout last night and has hives today on her arms.  She is requesting a prednisone injection today, quite adamantly.  She feels that the prednisone that she is taking is causing her to become ill.  She thinks that it is lowering her immune system so she catches colds.  This is what she attributes her current  rhinitis symptoms to.  She has been allergy tested in the distant past when she was followed by someone in [place].  She has never been allergy tested since establishing care with [place].  She does not use any nasal sprays.  Evidently, there was concern that her levothyroxine was causing her hives.  She is going to see an endocrinologist to get this managed.    Otherwise, there have been no changes to her past medical history, surgical history, family history, or social  history.    Review of Systems: a 14-point review of systems is pertinent for what is mentioned in HPI.  Otherwise, all other systems were negative. Constitutional: negative other than that listed in the HPI Eyes: negative other than that listed in the HPI Ears, nose, mouth, throat, and face: negative other than that listed in the HPI Respiratory: negative other than that listed in the HPI Cardiovascular: negative other than that listed in the HPI Gastrointestinal: negative other than that listed in the HPI Genitourinary: negative other than that listed in the HPI Integument: negative other than that listed in the HPI Hematologic: negative other than that listed in the HPI Musculoskeletal: negative other than that listed in the HPI Neurological: negative other than that listed in the HPI Allergy/Immunologic: negative other than that listed in the HPI    Objective:   Blood pressure 126/70, pulse 72, temperature 97.9 F (36.6 C), temperature source Oral, resp. rate 16, SpO2 96 %. There is no height or weight on file to calculate BMI.   Physical Exam:  General: Alert, interactive, in no acute distress. Pleasant female.  Eyes: No conjunctival injection bilaterally, no discharge on the right, no discharge on the left and no Horner-Trantas dots present. PERRL bilaterally. EOMI without pain. No photophobia.  Ears: Right TM pearly gray with normal light reflex, Left TM pearly gray with normal light reflex, Right TM intact without perforation and Left TM intact without perforation.  Nose/Throat: External nose within normal limits, nasal crease present and septum midline. Turbinates edematous with clear discharge. Posterior oropharynx erythematous with cobblestoning in the posterior oropharynx. Tonsils 2+ without exudates.  Tongue without thrush. Lungs: Clear to auscultation without wheezing, rhonchi or rales. No increased work of breathing. CV: Normal S1/S2. No murmurs. Capillary refill <2  seconds.  Skin: Warm and dry, without lesions or rashes. Neuro:   Grossly intact. No focal deficits appreciated. Responsive to questions.  Diagnostic studies: none       Malachi Bonds, MD  [facility]

## 2017-12-07 NOTE — Patient Instructions (Addendum)
1. Chronic Urticaria - Continue Xyzal (levocetirizine) 5 mg one table twice daily. - Add on Zyrtec (cetirizine)  twice daily.   - Continue ranitidine 150 once or twice a day for control of itching. - Continue montelukast 10 mg once a day to control itching and hives - Continue with hydroxyzine 25 mg once a day as needed for itching or hives as needed for breathing hives - Strongly consider Xolair.   2. Postnasal drip - We will look for environmental allergies in your blood today. - Nasacort sample provided (one spray per nostril up to twice daily).   3. Return in about 1 week (around 12/14/2017) or earlier when you decide to restart Xolair.   Please inform us of any Emergency Department visits, hospitalizations, or changes in symptoms. Call us before going to the ED for breathing or allergy symptoms since we might be able to fit you in for a sick visit. Feel free to contact us anytime with any questions, problems, or concerns.  It was a pleasure to meet you today!  Websites that have reliable patient information: 1. American Academy of Asthma, Allergy, and Immunology: www.aaaai.org 2. Food Allergy Research and Education (FARE): foodallergy.org 3. Mothers of Asthmatics: http://www.asthmacommunitynetwork.org 4. American College of Allergy, Asthma, and Immunology: www.acaai.org

## 2017-12-10 ENCOUNTER — Telehealth: Payer: Self-pay | Admitting: *Deleted

## 2017-12-10 NOTE — Telephone Encounter (Signed)
I reached out to patient to discuss her restarting Xolair for her hives.  She advised she was not ready to do so at this time.  I advised her that if she did decide to give it another try to get in contact me and we can discuss affordability options.

## 2017-12-10 NOTE — Telephone Encounter (Signed)
Noted. Thanks for reaching out, Morgan Lambert!   Malachi Bonds, MD [facility]

## 2017-12-15 LAB — IGE+ALLERGENS ZONE 2(30)
Alternaria Alternata IgE: <0.1 kU/L
Bahia Grass IgE: <0.1 kU/L
Cedar, Mountain IgE: <0.1 kU/L
Cladosporium Herbarum IgE: <0.1 kU/L
D Pteronyssinus IgE: <0.1 kU/L
Dog Dander IgE: <0.1 kU/L
Hickory, White IgE: <0.1 kU/L
IGE (IMMUNOGLOBULIN E), SERUM: 9 [IU]/mL (ref 6–495)
Maple/Box Elder IgE: <0.1 kU/L
Mucor Racemosus IgE: <0.1 kU/L
Mugwort IgE Qn: <0.1 kU/L
Nettle IgE: <0.1 kU/L
Oak, White IgE: <0.1 kU/L
Plantain, English IgE: <0.1 kU/L
Sheep Sorrel IgE Qn: <0.1 kU/L
Sweet gum IgE RAST Ql: <0.1 kU/L

## 2017-12-15 LAB — TRYPTASE: Tryptase: 4.8 ug/L (ref 2.2–13.2)

## 2018-01-03 ENCOUNTER — Other Ambulatory Visit: Payer: Self-pay | Admitting: Family Medicine

## 2018-01-03 ENCOUNTER — Other Ambulatory Visit: Payer: Self-pay | Admitting: Allergy and Immunology

## 2018-01-03 DIAGNOSIS — L508 Other urticaria: Secondary | ICD-10-CM

## 2018-01-31 ENCOUNTER — Other Ambulatory Visit: Payer: Self-pay | Admitting: Family Medicine

## 2018-03-07 ENCOUNTER — Other Ambulatory Visit: Payer: Self-pay | Admitting: Allergy and Immunology

## 2018-03-07 DIAGNOSIS — L508 Other urticaria: Secondary | ICD-10-CM

## 2018-03-18 ENCOUNTER — Other Ambulatory Visit: Payer: Self-pay | Admitting: Allergy and Immunology

## 2018-03-18 DIAGNOSIS — L508 Other urticaria: Secondary | ICD-10-CM

## 2018-05-13 ENCOUNTER — Telehealth: Payer: Self-pay

## 2018-05-13 NOTE — Telephone Encounter (Signed)
Pt called in stating since ranitidine was taking off the shelfs what could she change too. I informed her to try the pepcid and if it helps with the hives that's great if not call us back.

## 2018-06-02 ENCOUNTER — Other Ambulatory Visit: Payer: Self-pay | Admitting: Family Medicine

## 2018-06-03 NOTE — Telephone Encounter (Signed)
Courtesy refill  

## 2018-07-06 ENCOUNTER — Other Ambulatory Visit: Payer: Self-pay | Admitting: Family Medicine

## 2018-08-14 HISTORY — PX: WRIST FRACTURE SURGERY: SHX121

## 2018-09-08 ENCOUNTER — Other Ambulatory Visit: Payer: Self-pay | Admitting: Allergy and Immunology

## 2018-09-09 ENCOUNTER — Other Ambulatory Visit: Payer: Self-pay | Admitting: Allergy and Immunology

## 2018-09-09 DIAGNOSIS — L508 Other urticaria: Secondary | ICD-10-CM

## 2018-12-07 ENCOUNTER — Other Ambulatory Visit: Payer: Self-pay | Admitting: Family Medicine

## 2018-12-09 NOTE — Telephone Encounter (Signed)
Call to patient to make f/u appointment. Courtesy refill given.

## 2018-12-11 ENCOUNTER — Other Ambulatory Visit: Payer: Self-pay

## 2018-12-11 ENCOUNTER — Encounter: Payer: Self-pay | Admitting: Allergy and Immunology

## 2018-12-11 ENCOUNTER — Ambulatory Visit: Payer: Medicare HMO | Admitting: Allergy and Immunology

## 2018-12-11 DIAGNOSIS — J310 Chronic rhinitis: Secondary | ICD-10-CM | POA: Diagnosis not present

## 2018-12-11 DIAGNOSIS — L508 Other urticaria: Secondary | ICD-10-CM

## 2018-12-11 MED ORDER — FLUTICASONE PROPIONATE 50 MCG/ACT NA SUSP: 2.0000 | Freq: Every day | NASAL | 5 refills | Status: AC

## 2018-12-11 MED ORDER — MONTELUKAST SODIUM 10 MG PO TABS: ORAL_TABLET | ORAL | 0 refills | Status: DC

## 2018-12-11 MED ORDER — LEVOCETIRIZINE DIHYDROCHLORIDE 5 MG PO TABS: 5.0000 mg | ORAL_TABLET | Freq: Every day | ORAL | 0 refills | Status: DC | PRN

## 2018-12-11 NOTE — Assessment & Plan Note (Signed)
   A prescription has been provided for fluticasone nasal spray, one spray per nostril 1-2 times daily as needed. Proper nasal spray technique has been discussed.  Nasal saline spray (i.e., Simply Saline) or nasal saline lavage (i.e., NeilMed) is recommended as needed and prior to medicated nasal sprays.

## 2018-12-11 NOTE — Progress Notes (Signed)
Follow-up Telemedicine Note  RE: Morgan SayresKaren Lambert MRN: [id number] DOB: 1951/03/09 Date of Telemedicine Visit: 12/11/2018  Primary care provider: Wilburn MylarKelly, Samuel S, MD Referring provider: Wilburn MylarKelly, Samuel S, MD  Telemedicine Follow Up Visit via Telephone: I connected with Morgan Lambert for a follow up on 12/11/18 by telephone and verified that I am speaking with the correct person using two identifiers.   The limitations, risks, security and privacy concerns of performing an evaluation and management service by telemedicine, the availability of in person appointments, and that there may be a patient responsible charge related to this service were discussed. The patient expressed understanding and agreed to proceed.  Patient is at home.  Provider is at the office.  Visit start time: 3:45 pm Visit end time: 4:22 pm Insurance consent/check in by: [facility] consent and medical assistant/nurse: Logan  History of present illness: Morgan Lambert is a 68 y.o. female with chronic urticaria and rhinitis presenting today via telemedicine for follow-up.  She was last seen in this clinic in May 2019.  She reports that she typically takes cetirizine 2 times daily, and on occasion 3 times daily, as well as famotidine twice daily, montelukast 10 mg daily, and occasional hydroxyzine or diphenhydramine.  She received 2 rounds of Xolair injections but opted not to continue with this therapy.  Overall, she feels that her urticaria control has been "better in the past year than it has been in years."  She experiences occasional episodes of rhinorrhea despite daily antihistamine use.  Assessment and plan: Chronic urticaria  Continue H1/H2 receptor blockade, titrating to the lowest effective dose necessary to suppress urticaria.  To avoid diminishing benefit with daily use (tachyphylaxis) of second generation antihistamine, consider alternating every 1-2 months between fexofenadine (Allegra) and levocetirizine (Xyzal).   Continue montelukast 10 mg daily.  The montelukast boxed warning has been discussed and the patient has verbalized understanding.  May add diphenhydramine or hydroxyzine if needed.  Chronic rhinitis  A prescription has been provided for fluticasone nasal spray, one spray per nostril 1-2 times daily as needed. Proper nasal spray technique has been discussed.  Nasal saline spray (i.e., Simply Saline) or nasal saline lavage (i.e., NeilMed) is recommended as needed and prior to medicated nasal sprays.   Meds ordered this encounter  Medications  . levocetirizine (XYZAL) 5 MG tablet    Sig: Take 1 tablet (5 mg total) by mouth daily as needed for allergies.    Dispense:  90 tablet    Refill:  0  . montelukast (SINGULAIR) 10 MG tablet    Sig: TAKE 1 TABLET BY MOUTH EVERY NIGHT AT BEDTIME    Dispense:  90 tablet    Refill:  0    Please remind pt to schedule f/u appointment, no further refills.  . fluticasone (FLONASE) 50 MCG/ACT nasal spray    Sig: Place 2 sprays into both nostrils daily.    Dispense:  1 g    Refill:  5    Diagnostics: None.   Physical examination: Physical Exam Not obtained as encounter was done via telephone.   The following portions of the patient's history were reviewed and updated as appropriate: allergies, current medications, past family history, past medical history, past social history, past surgical history and problem list.  Allergies as of 12/11/2018      Reactions   Sulfa Antibiotics Itching   Sulfasalazine Itching      Medication List       Accurate as of Dec 11, 2018  9:53 PM. If you have any questions, ask your nurse or doctor.        STOP taking these medications   predniSONE 10 MG tablet Commonly known as:  DELTASONE Stopped by:  Wellington Hampshire, MD   ranitidine 150 MG tablet Commonly known as:  ZANTAC Stopped by:  Wellington Hampshire, MD     TAKE these medications   atorvastatin 40 MG tablet Commonly known as:  LIPITOR Take 5 mg  by mouth See admin instructions. 1 by mouth every 4 days.   Calcium 1000 + D 1000-800 MG-UNIT Tabs Generic drug:  Calcium Carb-Cholecalciferol   cetirizine 10 MG tablet Commonly known as:  ZYRTEC Take 20 mg by mouth daily.   diphenhydrAMINE 25 mg capsule Commonly known as:  BENADRYL Take 25 mg by mouth.   famotidine 20 MG tablet Commonly known as:  PEPCID Take 20 mg by mouth 2 (two) times daily.   fluticasone 50 MCG/ACT nasal spray Commonly known as:  Flonase Place 2 sprays into both nostrils daily. What changed:  See the new instructions. Changed by:  Wellington Hampshire, MD   hydrOXYzine 25 MG tablet Commonly known as:  ATARAX/VISTARIL TAKE 1 TABLET BY MOUTH DAILY AS NEEDED FOR ITCHING OR HIVES *NEEDS APPOINTMENT FOR MORE REFILLS*   levocetirizine 5 MG tablet Commonly known as:  XYZAL Take 1 tablet (5 mg total) by mouth daily as needed for allergies. What changed:  See the new instructions. Changed by:  Wellington Hampshire, MD   levothyroxine 75 MCG tablet Commonly known as:  SYNTHROID   montelukast 10 MG tablet Commonly known as:  SINGULAIR TAKE 1 TABLET BY MOUTH EVERY NIGHT AT BEDTIME   triamcinolone cream 0.1 % Commonly known as:  KENALOG   valACYclovir 500 MG tablet Commonly known as:  VALTREX       Allergies  Allergen Reactions  . Sulfa Antibiotics Itching  . Sulfasalazine Itching   Review of systems: Review of systems negative except as noted in HPI / PMHx or noted below: Constitutional: Negative.  HENT: Negative.   Eyes: Negative.  Respiratory: Negative.   Cardiovascular: Negative.  Gastrointestinal: Negative.  Genitourinary: Negative.  Musculoskeletal: Negative.  Neurological: Negative.  Endo/Heme/Allergies: Negative.  Cutaneous: Negative.  Past Medical History:  Diagnosis Date  . High cholesterol   . Hypothyroidism   . Urticaria     Family History  Problem Relation Age of Onset  . Asthma Grandchild   . Asthma Daughter   . Eczema  Daughter   . Allergic rhinitis Neg Hx   . Urticaria Neg Hx   . Immunodeficiency Neg Hx   . Atopy Neg Hx   . Angioedema Neg Hx     Social History   Socioeconomic History  . Marital status: Single    Spouse name: Not on file  . Number of children: Not on file  . Years of education: Not on file  . Highest education level: Not on file  Occupational History  . Not on file  Social Needs  . Financial resource strain: Not on file  . Food insecurity:    Worry: Not on file    Inability: Not on file  . Transportation needs:    Medical: Not on file    Non-medical: Not on file  Tobacco Use  . Smoking status: Former Smoker    Types: Cigarettes    Last attempt to quit: 06/13/2012    Years since quitting: 6.4  . Smokeless tobacco: Never Used  Substance and  Sexual Activity  . Alcohol use: Yes    Comment: occ  . Drug use: No  . Sexual activity: Not on file  Lifestyle  . Physical activity:    Days per week: Not on file    Minutes per session: Not on file  . Stress: Not on file  Relationships  . Social connections:    Talks on phone: Not on file    Gets together: Not on file    Attends religious service: Not on file    Active member of club or organization: Not on file    Attends meetings of clubs or organizations: Not on file    Relationship status: Not on file  . Intimate partner violence:    Fear of current or ex partner: Not on file    Emotionally abused: Not on file    Physically abused: Not on file    Forced sexual activity: Not on file  Other Topics Concern  . Not on file  Social History Narrative  . Not on file    Previous notes and tests were reviewed.  I discussed the assessment and treatment plan with the patient. The patient was provided an opportunity to ask questions and all were answered. The patient agreed with the plan and demonstrated an understanding of the instructions.   The patient was advised to call back or seek an in-person evaluation if the  symptoms worsen or if the condition fails to improve as anticipated.  I provided 37 minutes of non-face-to-face time during this encounter.  I appreciate the opportunity to take part in [place] care. Please do not hesitate to contact me with questions.  Sincerely,   R. Jorene Guest, MD

## 2018-12-11 NOTE — Patient Instructions (Signed)
Chronic urticaria  Continue H1/H2 receptor blockade, titrating to the lowest effective dose necessary to suppress urticaria.  To avoid diminishing benefit with daily use (tachyphylaxis) of second generation antihistamine, consider alternating every 1-2 months between fexofenadine (Allegra) and levocetirizine (Xyzal).  Continue montelukast 10 mg daily.  The montelukast boxed warning has been discussed and the patient has verbalized understanding.  May add diphenhydramine or hydroxyzine if needed.  Chronic rhinitis  A prescription has been provided for fluticasone nasal spray, one spray per nostril 1-2 times daily as needed. Proper nasal spray technique has been discussed.  Nasal saline spray (i.e., Simply Saline) or nasal saline lavage (i.e., NeilMed) is recommended as needed and prior to medicated nasal sprays.   Return in about 6 months (around 06/13/2019), or if symptoms worsen or fail to improve.

## 2018-12-11 NOTE — Assessment & Plan Note (Signed)
   Continue H1/H2 receptor blockade, titrating to the lowest effective dose necessary to suppress urticaria.  To avoid diminishing benefit with daily use (tachyphylaxis) of second generation antihistamine, consider alternating every 1-2 months between fexofenadine (Allegra) and levocetirizine (Xyzal).  Continue montelukast 10 mg daily.  The montelukast boxed warning has been discussed and the patient has verbalized understanding.  May add diphenhydramine or hydroxyzine if needed.

## 2019-06-04 ENCOUNTER — Other Ambulatory Visit: Payer: Self-pay | Admitting: Allergy and Immunology

## 2019-06-18 ENCOUNTER — Encounter: Payer: Self-pay | Admitting: Family Medicine

## 2019-06-18 ENCOUNTER — Other Ambulatory Visit: Payer: Self-pay

## 2019-06-18 ENCOUNTER — Ambulatory Visit (INDEPENDENT_AMBULATORY_CARE_PROVIDER_SITE_OTHER): Payer: Medicare HMO | Admitting: Family Medicine

## 2019-06-18 DIAGNOSIS — J310 Chronic rhinitis: Secondary | ICD-10-CM

## 2019-06-18 DIAGNOSIS — L508 Other urticaria: Secondary | ICD-10-CM | POA: Diagnosis not present

## 2019-06-18 MED ORDER — LEVOCETIRIZINE DIHYDROCHLORIDE 5 MG PO TABS: 5.0000 mg | ORAL_TABLET | Freq: Every day | ORAL | 1 refills | Status: DC | PRN

## 2019-06-18 MED ORDER — MONTELUKAST SODIUM 10 MG PO TABS: ORAL_TABLET | ORAL | 5 refills | Status: DC

## 2019-06-18 NOTE — Progress Notes (Addendum)
RE: Morgan Lambert MRN: [id number] DOB: 11-15-1950 Date of Telemedicine Visit: 06/18/2019  Referring provider: Delilah Shan, MD Primary care provider: Delilah Shan, MD  Chief Complaint: Allergies and Urticaria   Telemedicine Follow Up Visit via Telephone: I connected with Morgan Lambert for a follow up on 06/18/19 by telephone and verified that I am speaking with the correct person using two identifiers.   I discussed the limitations, risks, security and privacy concerns of performing an evaluation and management service by telephone and the availability of in person appointments. I also discussed with the patient that there may be a patient responsible charge related to this service. The patient expressed understanding and agreed to proceed.  Patient is at home  Provider is at the office.  Visit start time: 11:00 Visit end time: 11:36 Insurance consent/check in by: [place] consent and medical assistant/nurse: Katherina Right  History of Present Illness: She is a 68 y.o. female, who is being followed for chronic urticaria and allergic rhinitis. Her previous allergy office visit was on 12/11/2018 with Dr. Verlin Fester. At today's visit, she reports that her chronic urticaria has been moderately well controlled with an intermittent flare and remission pattern. She reports that she is currently taking an antihistamine twice a day, famotidine 20 mg once a day, and montelukast 10 mg once a day. She reports that during a flare, she adds Benadryl or hydroxyzine as needed. She occasionally uses triamcinolone 0.1% cream to red, itchy areas below her neck. Allergic rhinitis is reported as well controlled with occasional Flonase. She is not currently using a saline nasal rinse. Her current medications are listed in the chart.   Assessment and Plan: Morgan Lambert is a 68 y.o. female with: Patient Instructions  Chronic urticaria Continue with H1H2 blockade using the lowest amount of medication needed while  still receiving relief  . Cetirizine (Zyrtec) 10mg  twice a day and famotidine (Pepcid) 20 mg twice a day. If no symptoms for 7-14 days then decrease to. . Cetirizine (Zyrtec) 10mg  twice a day and famotidine (Pepcid) 20 mg once a day.  If no symptoms for 7-14 days then decrease to. . Cetirizine (Zyrtec) 10mg  twice a day.  If no symptoms for 7-14 days then decrease to. . Cetirizine (Zyrtec) 10mg  once a day. In place of cetirizine, you may use levocetirizine (Xyzal) or fexofenadine (Allegra).   May use either Benadryl (diphenhydramine) or hydroxyzine  as needed for breakthrough hives       If symptoms return, then step up dosage  Keep a detailed symptom journal including foods eaten, contact with allergens, medications taken, weather changes.   Allergic rhinitis Continue Flonase 1-2 sprays in each nostril once a day as needed for a stuffy nose Consider saline nasal rinses as needed for nasal symptoms. Use this before any medicated nasal sprays for best result  Call the clinic if this treatment plan is not working well for you  Follow up in 6 months or sooner if needed    Return in about 6 months (around 12/16/2019), or if symptoms worsen or fail to improve.  Meds ordered this encounter  Medications  . montelukast (SINGULAIR) 10 MG tablet    Sig: TAKE ONE TABLET BY MOUTH EVERY NIGHT AT BEDTIME    Dispense:  30 tablet    Refill:  5    Place on hold  . levocetirizine (XYZAL) 5 MG tablet    Sig: Take 1 tablet (5 mg total) by mouth daily as needed for allergies.  Dispense:  90 tablet    Refill:  1    On hold    Medication List:  Current Outpatient Medications  Medication Sig Dispense Refill  . atorvastatin (LIPITOR) 10 MG tablet Take 10 mg by mouth daily.    . Calcium Carb-Cholecalciferol (CALCIUM 1000 + D) 1000-800 MG-UNIT TABS     . cetirizine (ZYRTEC) 10 MG tablet Take 20 mg by mouth daily.     . diphenhydrAMINE (BENADRYL) 25 mg capsule Take 25 mg by mouth.    . famotidine  (PEPCID) 20 MG tablet Take 20 mg by mouth 2 (two) times daily.    . fexofenadine (ALLEGRA) 180 MG tablet Take by mouth.    . fluticasone (FLONASE) 50 MCG/ACT nasal spray Place 2 sprays into both nostrils daily. 1 g 5  . hydrOXYzine (ATARAX/VISTARIL) 25 MG tablet TAKE 1 TABLET BY MOUTH DAILY AS NEEDED FOR ITCHING OR HIVES *NEEDS APPOINTMENT FOR MORE REFILLS* 30 tablet 5  . levocetirizine (XYZAL) 5 MG tablet Take 1 tablet (5 mg total) by mouth daily as needed for allergies. 90 tablet 1  . levothyroxine (SYNTHROID, LEVOTHROID) 75 MCG tablet     . montelukast (SINGULAIR) 10 MG tablet TAKE ONE TABLET BY MOUTH EVERY NIGHT AT BEDTIME 30 tablet 5  . triamcinolone cream (KENALOG) 0.1 %     . valACYclovir (VALTREX) 500 MG tablet      Current Facility-Administered Medications  Medication Dose Route Frequency Provider Last Rate Last Dose  . omalizumab Geoffry Paradise) injection 300 mg  300 mg Subcutaneous Q28 days Stephannie Li A, MD   300 mg at 11/23/16 1036   Allergies: Allergies  Allergen Reactions  . Sulfa Antibiotics Itching  . Sulfasalazine Itching  . Amoxicillin-Pot Clavulanate Rash    Yeast infection all on body (thrush)   I reviewed her past medical history, social history, family history, and environmental history and no significant changes have been reported from previous visit on 12/11/2018.  Objective: Physical Exam Not obtained as encounter was done via telephone.   Previous notes and tests were reviewed.  I discussed the assessment and treatment plan with the patient. The patient was provided an opportunity to ask questions and all were answered. The patient agreed with the plan and demonstrated an understanding of the instructions.   The patient was advised to call back or seek an in-person evaluation if the symptoms worsen or if the condition fails to improve as anticipated.  I provided 36 minutes of non-face-to-face time during this encounter.  It was my pleasure to participate in  [name]'s care today. Please feel free to contact me with any questions or concerns.   Sincerely,  Thermon Leyland, FNP   ________________________________________________  I have provided oversight concerning [facility]'s evaluation and treatment of this patient's health issues addressed during today's encounter.  I agree with the assessment and therapeutic plan as outlined in the note.   Signed,   R Jorene Guest, MD

## 2019-06-18 NOTE — Patient Instructions (Addendum)
Chronic urticaria Continue with H1H2 blockade using the lowest amount of medication needed while still receiving relief  . Cetirizine (Zyrtec) 10mg  twice a day and famotidine (Pepcid) 20 mg twice a day. If no symptoms for 7-14 days then decrease to. . Cetirizine (Zyrtec) 10mg  twice a day and famotidine (Pepcid) 20 mg once a day.  If no symptoms for 7-14 days then decrease to. . Cetirizine (Zyrtec) 10mg  twice a day.  If no symptoms for 7-14 days then decrease to. . Cetirizine (Zyrtec) 10mg  once a day. In place of cetirizine (Zyrtec), you may use levocetirizine (Xyzal) or fexofenadine (Allegra).   May use either Benadryl (diphenhydramine) or hydroxyzine  as needed for breakthrough hives       If symptoms return, then step up dosage  Keep a detailed symptom journal including foods eaten, contact with allergens, medications taken, weather changes.   Allergic rhinitis Continue Flonase 1-2 sprays in each nostril once a day as needed for a stuffy nose Consider saline nasal rinses as needed for nasal symptoms. Use this before any medicated nasal sprays for best result  Call the clinic if this treatment plan is not working well for you  Follow up in 6 months or sooner if needed

## 2019-12-17 ENCOUNTER — Ambulatory Visit: Payer: Self-pay | Admitting: Allergy and Immunology

## 2020-02-17 ENCOUNTER — Ambulatory Visit: Payer: Self-pay | Admitting: Allergy and Immunology

## 2020-02-17 ENCOUNTER — Ambulatory Visit (INDEPENDENT_AMBULATORY_CARE_PROVIDER_SITE_OTHER): Payer: Medicare HMO | Admitting: Family

## 2020-02-17 ENCOUNTER — Encounter: Payer: Self-pay | Admitting: Family

## 2020-02-17 ENCOUNTER — Other Ambulatory Visit: Payer: Self-pay

## 2020-02-17 DIAGNOSIS — L508 Other urticaria: Secondary | ICD-10-CM

## 2020-02-17 DIAGNOSIS — J310 Chronic rhinitis: Secondary | ICD-10-CM

## 2020-02-17 DIAGNOSIS — R062 Wheezing: Secondary | ICD-10-CM | POA: Diagnosis not present

## 2020-02-17 MED ORDER — LEVOCETIRIZINE DIHYDROCHLORIDE 5 MG PO TABS: 5.0000 mg | ORAL_TABLET | Freq: Every day | ORAL | 1 refills | Status: AC | PRN

## 2020-02-17 MED ORDER — AZELASTINE HCL 0.1 % NA SOLN: NASAL | 5 refills | Status: AC

## 2020-02-17 MED ORDER — MONTELUKAST SODIUM 10 MG PO TABS: ORAL_TABLET | ORAL | 1 refills | Status: AC

## 2020-02-17 MED ORDER — ALBUTEROL SULFATE HFA 108 (90 BASE) MCG/ACT IN AERS: 2.0000 | INHALATION_SPRAY | RESPIRATORY_TRACT | 1 refills | Status: AC | PRN

## 2020-02-17 NOTE — Patient Instructions (Addendum)
Chronic urticaria Continue Zyrtec 10 mg- take one tablet twice a day to help with itching Continue Famotidine one tablet once a day to twice a day to help with itching Continue montelukast 10 mg once a day to help with itching May use Benadryl or hydroxyzine for breakthrough hives   Non allergic rhinitis Continue Flonase nose spray- 1-2 sprays each nostril once a day as needed for nasal congestion Start azelastine nasal spray 1-2 sprays each nostril twice a day as needed for drainage. May use saline nasal spray or saline nasal rinse to help with drainage. Please use this prior to any medicated nasal sprays.  Wheezing/tightness in chest Start ProAir 2 puffs every 4 hours as needed for cough, wheeze, tightness in chest or shortness of breath.  Please let us know if this treatment plan is not working well for you. Schedule follow up appointment in 1 month with spirometry.

## 2020-02-17 NOTE — Progress Notes (Addendum)
RE: Morgan Lambert MRN: [id number] DOB: 09/15/1950 Date of Telemedicine Visit: 02/17/2020  Referring provider: Wilburn Mylar, MD Primary care provider: Wilburn Mylar, MD  Chief Complaint: Urticaria   Telemedicine Follow Up Visit via Telephone: I connected with Morgan Lambert for a follow up on 02/17/20 by telephone and verified that I am speaking with the correct person using two identifiers.   I discussed the limitations, risks, security and privacy concerns of performing an evaluation and management service by telephone and the availability of in person appointments. I also discussed with the patient that there may be a patient responsible charge related to this service. The patient expressed understanding and agreed to proceed.  Patient is at home  Provider is at the office.  Visit start time: 10:31 am Visit end time: 11:06 am Insurance consent/check in by: Genelle Bal C. Medical consent and medical assistant/nurse: Reece Levy.  History of Present Illness: She is a 69 y.o. female, who is being followed for chronic urticaria and chronic rhinitis. Her previous allergy office visit was on 06/18/2019 with Thermon Leyland, FNP.   Chronic urticaria is reported as moderately controlled with the use of Zyrtec 1 tablet twice a day on most days.  On the days that she does not take Zyrtec twice a day she will either take Xyzal for 1 dose and Zyrtec for the second dose.  She also reports that she will also switch to Allegra, but is not able to tolerate it longer than 2 weeks because it not control her itching after that.  She also takes famotidine 1 tablet once a day to twice a day and montelukast 10 mg once a day.  She reports that her hives are doing well and that every few months she will have a flare where she will need to take extra medicine and they will eventually go away.  Chronic rhinitis is reported as not well controlled with nasal congestion, postnasal drip,and occasional sneezing.  She reports that  she has always had these symptoms, but last night they were worse.  This morning she had some sinus tenderness, but that is now gone.  Yesterday evening her postnasal drip became clear with a touch of green and brown.  She reports that it has been these colors before. She denies any fever or chills  She also has a cough that she feels like is coming from her throat, but last night it felt like it was from deep in her lungs. She has tried sinus rinse in the past to help with her drainage and she did not feel like it helped.  She also reports for the past several months she has had off-and-on wheezing and tightness in her chest.  She does not have an albuterol inhaler.  Assessment and Plan: Morgan Lambert is a 69 y.o. female with: Patient Instructions  Chronic urticaria Continue Zyrtec 10 mg- take one tablet twice a day to help with itching Continue Famotidine one tablet once a day to twice a day to help with itching Continue montelukast 10 mg once a day to help with itching May use Benadryl or hydroxyzine for breakthrough hives   Non allergic rhinitis Continue Flonase nose spray- 1-2 sprays each nostril once a day as needed for nasal congestion Start azelastine nasal spray 1-2 sprays each nostril twice a day as needed for drainage. May use saline nasal spray or saline nasal rinse to help with drainage. Please use this prior to any medicated nasal sprays.  Wheezing/tightness in chest Start ProAir  2 puffs every 4 hours as needed for cough, wheeze, tightness in chest or shortness of breath.  Please let us know if this treatment plan is not working well for you. Schedule follow up appointment in 1 month with spirometry.    Return in about 4 weeks (around 03/16/2020), or if symptoms worsen or fail to improve.  No orders of the defined types were placed in this encounter.  Lab Orders  No laboratory test(s) ordered today    Diagnostics: None.  Medication List:  Current Outpatient Medications   Medication Sig Dispense Refill  . atorvastatin (LIPITOR) 10 MG tablet Take 10 mg by mouth every 3 (three) days.     . Calcium Carb-Cholecalciferol (CALCIUM 1000 + D) 1000-800 MG-UNIT TABS     . cetirizine (ZYRTEC) 10 MG tablet Take 20 mg by mouth daily.     . diphenhydrAMINE (BENADRYL) 25 mg capsule Take 25 mg by mouth.    . famotidine (PEPCID) 20 MG tablet Take 20 mg by mouth 2 (two) times daily.    . fexofenadine (ALLEGRA) 180 MG tablet Take by mouth.    . fluticasone (FLONASE) 50 MCG/ACT nasal spray Place 2 sprays into both nostrils daily. 1 g 5  . hydrOXYzine (ATARAX/VISTARIL) 25 MG tablet TAKE 1 TABLET BY MOUTH DAILY AS NEEDED FOR ITCHING OR HIVES *NEEDS APPOINTMENT FOR MORE REFILLS* 30 tablet 5  . levocetirizine (XYZAL) 5 MG tablet Take 1 tablet (5 mg total) by mouth daily as needed for allergies. 90 tablet 1  . levothyroxine (SYNTHROID, LEVOTHROID) 75 MCG tablet     . montelukast (SINGULAIR) 10 MG tablet TAKE ONE TABLET BY MOUTH EVERY NIGHT AT BEDTIME 30 tablet 5  . triamcinolone cream (KENALOG) 0.1 %     . valACYclovir (VALTREX) 500 MG tablet  (Patient not taking: Reported on 02/17/2020)     Current Facility-Administered Medications  Medication Dose Route Frequency Provider Last Rate Last Admin  . omalizumab Geoffry Paradise) injection 300 mg  300 mg Subcutaneous Q28 days Stephannie Li A, MD   300 mg at 11/23/16 1036   Allergies: Allergies  Allergen Reactions  . Sulfa Antibiotics Itching  . Sulfasalazine Itching   I reviewed her past medical history, social history, family history, and environmental history and no significant changes have been reported from previous visit on 06/27/2019.  Review of Systems  Constitutional: Negative for chills and fever.  HENT: Positive for postnasal drip. Negative for rhinorrhea, sinus pressure and sinus pain.   Respiratory: Positive for wheezing. Negative for chest tightness and shortness of breath.   Cardiovascular: Negative for chest pain and  palpitations.  Gastrointestinal: Negative for abdominal pain.  Genitourinary: Negative for difficulty urinating.  Neurological: Positive for headaches. Negative for dizziness.   Objective: Physical Exam Not obtained as encounter was done via telephone.   Previous notes and tests were reviewed.  I discussed the assessment and treatment plan with the patient. The patient was provided an opportunity to ask questions and all were answered. The patient agreed with the plan and demonstrated an understanding of the instructions.   The patient was advised to call back or seek an in-person evaluation if the symptoms worsen or if the condition fails to improve as anticipated.  I provided 35 minutes of non-face-to-face time during this encounter.  It was my pleasure to participate in Morgan Lambert's care today. Please feel free to contact me with any questions or concerns.   Sincerely,  Morgan Settle, FNP  ________________________________________________  I have provided oversight concerning  Morgan Lambert's evaluation and treatment of this patient's health issues addressed during today's encounter.  I agree with the assessment and therapeutic plan as outlined in the note.   Signed,   Morgan Morgan Guest, MD

## 2020-04-13 ENCOUNTER — Ambulatory Visit: Payer: Medicare HMO | Admitting: Allergy and Immunology
